# Patient Record
Sex: Female | Born: 1970 | Race: Black or African American | Hispanic: No | Marital: Single | Smoking: Never smoker
Health system: Southern US, Community
[De-identification: ages and names within clinical notes are randomized; demographics above are authoritative.]

## PROBLEM LIST (undated history)

## (undated) DIAGNOSIS — R030 Elevated blood-pressure reading, without diagnosis of hypertension: Secondary | ICD-10-CM

## (undated) DIAGNOSIS — B977 Papillomavirus as the cause of diseases classified elsewhere: Secondary | ICD-10-CM

## (undated) DIAGNOSIS — Z8742 Personal history of other diseases of the female genital tract: Secondary | ICD-10-CM

## (undated) DIAGNOSIS — D259 Leiomyoma of uterus, unspecified: Secondary | ICD-10-CM

## (undated) HISTORY — DX: Personal history of other diseases of the female genital tract: Z87.42

## (undated) HISTORY — PX: WISDOM TOOTH EXTRACTION: SHX21

## (undated) HISTORY — DX: Leiomyoma of uterus, unspecified: D25.9

## (undated) HISTORY — DX: Papillomavirus as the cause of diseases classified elsewhere: B97.7

## (undated) HISTORY — PX: OTHER SURGICAL HISTORY: SHX169

## (undated) HISTORY — DX: Elevated blood-pressure reading, without diagnosis of hypertension: R03.0

---

## 2001-05-20 ENCOUNTER — Emergency Department (HOSPITAL_COMMUNITY): Admission: EM | Admit: 2001-05-20 | Discharge: 2001-05-20 | Payer: Self-pay | Admitting: Emergency Medicine

## 2004-11-10 ENCOUNTER — Ambulatory Visit: Payer: Self-pay | Admitting: Internal Medicine

## 2005-01-06 ENCOUNTER — Ambulatory Visit: Payer: Self-pay | Admitting: Internal Medicine

## 2005-02-04 ENCOUNTER — Ambulatory Visit: Payer: Self-pay | Admitting: Internal Medicine

## 2005-05-25 ENCOUNTER — Ambulatory Visit: Payer: Self-pay | Admitting: Internal Medicine

## 2006-03-30 ENCOUNTER — Other Ambulatory Visit: Admission: RE | Admit: 2006-03-30 | Discharge: 2006-03-30 | Payer: Self-pay | Admitting: Internal Medicine

## 2006-03-30 ENCOUNTER — Encounter: Payer: Self-pay | Admitting: Internal Medicine

## 2006-03-30 ENCOUNTER — Ambulatory Visit: Payer: Self-pay | Admitting: Internal Medicine

## 2006-03-30 LAB — CONVERTED CEMR LAB
Chol/HDL Ratio, serum: 3.6
Glucose, Bld: 93 mg/dL (ref 70–99)
HCT: 41.4 % (ref 36.0–46.0)
LDL Cholesterol: 111 mg/dL — ABNORMAL HIGH (ref 0–99)
MCHC: 33.4 g/dL (ref 30.0–36.0)
TSH: 0.93 microintl units/mL (ref 0.35–5.50)
VLDL: 10 mg/dL (ref 0–40)
WBC: 3.7 10*3/uL — ABNORMAL LOW (ref 4.5–10.5)

## 2007-04-02 ENCOUNTER — Ambulatory Visit: Payer: Self-pay | Admitting: Internal Medicine

## 2007-04-02 ENCOUNTER — Other Ambulatory Visit: Admission: RE | Admit: 2007-04-02 | Discharge: 2007-04-02 | Payer: Self-pay | Admitting: Internal Medicine

## 2007-04-02 ENCOUNTER — Encounter: Payer: Self-pay | Admitting: Internal Medicine

## 2007-04-02 DIAGNOSIS — J45909 Unspecified asthma, uncomplicated: Secondary | ICD-10-CM | POA: Insufficient documentation

## 2007-04-05 ENCOUNTER — Encounter (INDEPENDENT_AMBULATORY_CARE_PROVIDER_SITE_OTHER): Payer: Self-pay | Admitting: *Deleted

## 2007-04-05 LAB — CONVERTED CEMR LAB
BUN: 10 mg/dL (ref 6–23)
Calcium: 9 mg/dL (ref 8.4–10.5)
Cholesterol: 153 mg/dL (ref 0–200)
Eosinophils Absolute: 0 10*3/uL (ref 0.0–0.6)
GFR calc Af Amer: 106 mL/min
GFR calc non Af Amer: 87 mL/min
HDL: 57.5 mg/dL (ref >39.0)
Hemoglobin: 13 g/dL (ref 12.0–15.0)
Lymphocytes Relative: 21.3 % (ref 12.0–46.0)
MCHC: 34.9 g/dL (ref 30.0–36.0)
MCV: 85.3 fL (ref 78.0–100.0)
Monocytes Absolute: 0.4 10*3/uL (ref 0.2–0.7)
Monocytes Relative: 4.4 % (ref 3.0–11.0)
Neutro Abs: 6.6 10*3/uL (ref 1.4–7.7)
Platelets: 296 10*3/uL (ref 150–400)
Potassium: 3.5 meq/L (ref 3.5–5.1)
Triglycerides: 71 mg/dL (ref 0–149)

## 2007-07-31 ENCOUNTER — Encounter: Payer: Self-pay | Admitting: Internal Medicine

## 2008-06-13 DIAGNOSIS — D259 Leiomyoma of uterus, unspecified: Secondary | ICD-10-CM

## 2008-06-18 ENCOUNTER — Ambulatory Visit (HOSPITAL_COMMUNITY): Admission: RE | Admit: 2008-06-18 | Discharge: 2008-06-18 | Payer: Self-pay | Admitting: Obstetrics & Gynecology

## 2009-05-19 ENCOUNTER — Telehealth (INDEPENDENT_AMBULATORY_CARE_PROVIDER_SITE_OTHER): Payer: Self-pay | Admitting: *Deleted

## 2009-05-20 ENCOUNTER — Ambulatory Visit: Payer: Self-pay | Admitting: Internal Medicine

## 2009-05-20 DIAGNOSIS — R030 Elevated blood-pressure reading, without diagnosis of hypertension: Secondary | ICD-10-CM

## 2009-05-20 HISTORY — DX: Elevated blood-pressure reading, without diagnosis of hypertension: R03.0

## 2009-05-22 LAB — CONVERTED CEMR LAB
BUN: 11 mg/dL (ref 6–23)
Basophils Relative: 1.7 % (ref 0.0–3.0)
Chloride: 105 meq/L (ref 96–112)
Creatinine, Ser: 0.8 mg/dL (ref 0.4–1.2)
Eosinophils Relative: 4.2 % (ref 0.0–5.0)
GFR calc non Af Amer: 104.18 mL/min (ref >60)
HCT: 44.5 % (ref 36.0–46.0)
MCV: 97.9 fL (ref 78.0–100.0)
Monocytes Absolute: 0.3 10*3/uL (ref 0.1–1.0)
Monocytes Relative: 7.1 % (ref 3.0–12.0)
Neutrophils Relative %: 41.5 % — ABNORMAL LOW (ref 43.0–77.0)
Platelets: 254 10*3/uL (ref 150.0–400.0)
Potassium: 4.6 meq/L (ref 3.5–5.1)
RBC: 4.54 M/uL (ref 3.87–5.11)
TSH: 0.75 microintl units/mL (ref 0.35–5.50)
WBC: 4 10*3/uL — ABNORMAL LOW (ref 4.5–10.5)

## 2009-05-26 ENCOUNTER — Encounter (INDEPENDENT_AMBULATORY_CARE_PROVIDER_SITE_OTHER): Payer: Self-pay | Admitting: *Deleted

## 2009-05-26 ENCOUNTER — Ambulatory Visit: Payer: Self-pay | Admitting: Internal Medicine

## 2009-09-02 ENCOUNTER — Telehealth (INDEPENDENT_AMBULATORY_CARE_PROVIDER_SITE_OTHER): Payer: Self-pay | Admitting: *Deleted

## 2010-07-13 NOTE — Progress Notes (Signed)
Summary: rx  Phone Note Refill Request Call back at [phone] Message from:  Patient  Refills Requested: Medication #1:  ALBUTEROL 90 MCG/ACT  AERS PRN [facility] [address] [place]  Initial call taken by: Kandice Hams,  September 02, 2009 3:01 PM  Follow-up for Phone Call        Spoke with pt informed rx sent to [facility], ov due and scheduled .Kandice Hams  September 02, 2009 3:31 PM  Follow-up by: Kandice Hams,  September 02, 2009 3:31 PM    Prescriptions: ALBUTEROL 90 MCG/ACT  AERS (ALBUTEROL) PRN  #1 x 0   Entered by:   Kandice Hams   Authorized by:   Nolon Rod. Paz MD   Signed by:   Kandice Hams on 09/02/2009   Method used:   Faxed to ...       [facility] [address] [id number]* (retail)       [address] [place], [place]  [address]       Ph: [phone]       Fax: [phone]   RxID:   [id number]

## 2011-01-05 ENCOUNTER — Ambulatory Visit (INDEPENDENT_AMBULATORY_CARE_PROVIDER_SITE_OTHER): Payer: 59 | Admitting: Internal Medicine

## 2011-01-05 ENCOUNTER — Encounter: Payer: Self-pay | Admitting: Internal Medicine

## 2011-01-05 DIAGNOSIS — Z0000 Encounter for general adult medical examination without abnormal findings: Secondary | ICD-10-CM | POA: Insufficient documentation

## 2011-01-05 DIAGNOSIS — K219 Gastro-esophageal reflux disease without esophagitis: Secondary | ICD-10-CM

## 2011-01-05 DIAGNOSIS — M549 Dorsalgia, unspecified: Secondary | ICD-10-CM

## 2011-01-05 MED ORDER — ESOMEPRAZOLE MAGNESIUM 40 MG PO CPDR: 40.0000 mg | DELAYED_RELEASE_CAPSULE | Freq: Every day | ORAL | Status: DC

## 2011-01-05 MED ORDER — ALBUTEROL 90 MCG/ACT IN AERS: 2.0000 | INHALATION_SPRAY | RESPIRATORY_TRACT | Status: DC | PRN

## 2011-01-05 NOTE — Patient Instructions (Signed)
Came back fasting: FLP CBC CMP TSH---- dx V70 nexium x 2 months, then as needed Call if the stomach is not improving or if symptoms resurface

## 2011-01-05 NOTE — Assessment & Plan Note (Addendum)
See PHI, has back pain, no hip problems;rec judicious use of motrin-tylenol, refer  to Dr Christell Faith

## 2011-01-05 NOTE — Progress Notes (Signed)
  Subjective:    Patient ID: Maureen Spencer, female    DOB: [birth year], 40 y.o.   MRN: [id number]  HPI Complete physical exam From time to time she has heartburn, also occasional epigastric burning feeling that the food gets stuck in the stomach. No chest pain. Is not taking any medications for acid. No history of EGD ;occasionally has nausea, no vomiting. Denies constipation or blood in the stools.   Also from time to time has "hip pain" , she actually pointed out to the right lower back. Pain is worse if she needs to sit down in a very low chair  Past Medical History  Diagnosis Date  . Asthma    No past surgical history on file. History   Social History  . Marital Status: Married    Spouse Name: N/A    Number of Children: 1  . Years of Education: N/A   Occupational History  . [employer], manager account dept.    Social History Main Topics  . Smoking status: Never Smoker   . Smokeless tobacco: Not on file  . Alcohol Use: Yes     Social  . Drug Use: No  . Sexually Active: Not on file   Other Topics Concern  . Not on file   Social History Narrative   Has a 49 y/o son--- diet:does try to eat healthy--- exercise ~ 3 /week   Family History  Problem Relation Age of Onset  . Diabetes Other     Grandmonther-not specific  . Coronary artery disease Neg Hx   . Colon cancer Neg Hx   . Breast cancer Neg Hx       Review of Systems  Constitutional: Negative for fever and fatigue.  Respiratory: Negative for cough and wheezing.   Cardiovascular: Negative for chest pain and leg swelling.  Genitourinary:       NL periods, not heavy  Psychiatric/Behavioral:       No anxiety- depression       Objective:   Physical Exam  Constitutional: She is oriented to person, place, and time. She appears well-developed and well-nourished. No distress.  HENT:  Head: Normocephalic and atraumatic.  Eyes: No scleral icterus.  Neck: No thyromegaly present.  Cardiovascular: Normal rate, regular  rhythm and normal heart sounds.   No murmur heard. Pulmonary/Chest: Effort normal and breath sounds normal. No respiratory distress. She has no wheezes. She has no rales.  Abdominal: Soft. Bowel sounds are normal. She exhibits no distension. There is no tenderness. There is no rebound and no guarding.  Musculoskeletal: She exhibits no edema.       Slightly tender at the right lower back. Hip rotation normal bilaterally. Lower extremity strength and gait normal  Neurological: She is alert and oriented to person, place, and time.  Skin: Skin is warm and dry. She is not diaphoretic.  Psychiatric: She has a normal mood and affect. Her behavior is normal. Judgment and thought content normal.          Assessment & Plan:

## 2011-01-05 NOTE — Assessment & Plan Note (Addendum)
Td  2003 Never had a Cscope Sees gyn regularly  Labs Cont w/ healthy life style

## 2011-01-05 NOTE — Assessment & Plan Note (Signed)
occ heartburn and epigastric dyscomfort, rec nexium x 2 months, then prn, if sx persist or worse , pt will call for a referral

## 2012-01-11 ENCOUNTER — Encounter: Payer: Self-pay | Admitting: *Deleted

## 2012-01-11 ENCOUNTER — Telehealth: Payer: Self-pay | Admitting: Internal Medicine

## 2012-01-11 ENCOUNTER — Ambulatory Visit (INDEPENDENT_AMBULATORY_CARE_PROVIDER_SITE_OTHER): Payer: 59 | Admitting: Internal Medicine

## 2012-01-11 ENCOUNTER — Encounter: Payer: Self-pay | Admitting: Internal Medicine

## 2012-01-11 VITALS — BP 124/72 | HR 81 | Temp 98.5°F | Wt 175.2 lb

## 2012-01-11 DIAGNOSIS — N390 Urinary tract infection, site not specified: Secondary | ICD-10-CM

## 2012-01-11 LAB — POCT URINALYSIS DIPSTICK
Bilirubin, UA: NEGATIVE
Glucose, UA: NEGATIVE
Ketones, UA: NEGATIVE
Nitrite, UA: POSITIVE

## 2012-01-11 MED ORDER — ALBUTEROL SULFATE HFA 108 (90 BASE) MCG/ACT IN AERS: 2.0000 | INHALATION_SPRAY | RESPIRATORY_TRACT | Status: DC | PRN

## 2012-01-11 MED ORDER — NITROFURANTOIN MONOHYD MACRO 100 MG PO CAPS: 100.0000 mg | ORAL_CAPSULE | Freq: Two times a day (BID) | ORAL | Status: AC

## 2012-01-11 NOTE — Telephone Encounter (Signed)
Caller: [name]/Patient; PCP: Marga Melnick; CB#: [phone];  Call regarding Medication Question.  Seen in office on 7/31.  Pharmacy has not rec'd Rx. for Nitrofurantoin.  Uses  [facility] on [address].  Verified pharmacy information in Epic and the Rx in EMR.  Called in Rx Macrobid as per order.  Caller informed. Medication Questions protocol used.

## 2012-01-11 NOTE — Patient Instructions (Addendum)
Drink as much nondairy fluids as possible. Avoid spicy foods or alcohol as  these may aggravate the symptoms. Do not take decongestants. Avoid narcotics if possible.  Please try to go on My Chart within the next 24 hours to allow me to release the results directly to you. www

## 2012-01-11 NOTE — Progress Notes (Signed)
  Subjective:    Patient ID: Maureen Spencer, female    DOB: [birth year], 41 y.o.   MRN: [id number]  HPI She began to have urinary frequency 01/10/12 associated with terminal pressure with urination. Last night she experienced nocturia X9. She's also noted some blood on the tissue but not in the toilet water or in  the urine itself. She also denies pyuria.  Today she's had some left flank sharp pain  Past medical history/family history/social history were all reviewed and updated. No pertinent data    Review of Systems She denies fever, chills, or sweats. She has not had frequent urinary tract infections     Objective:   Physical Exam General appearance is one of good health and nourishment w/o distress.  Eyes: No conjunctival inflammation or scleral icterus is present.  Oral exam: Dental hygiene is good; lips and gums are healthy appearing.There is no oropharyngeal erythema or exudate noted.   Heart:  Normal rate and regular rhythm. S1 and S2 normal without gallop, murmur, click, rub or other extra sounds .S4   Lungs:Chest clear to auscultation; no wheezes, rhonchi,rales ,or rubs present.No increased work of breathing.   Abdomen: bowel sounds normal, soft and non-tender without masses, organomegaly or hernias noted.  No guarding or rebound . No pain to percussion over the left lung.  Musculoskeletal: Negative straight leg raising to 90 bilaterally  Skin:Warm & dry.  Intact without suspicious lesions or rashes. Striae  Lymphatic: No lymphadenopathy is noted about the head, neck, axilla areas.              Assessment & Plan:  #1 frequency, nocturia, terminal dysuria, and slight hematuria. Associated left flank pain without fever, chills, or sweats. Urine shows moderate/2+ leukocytes; positive nitrites and moderate blood. Culture pending  #2 past medical history of possible angioedema with penicillin  Plan: Nitrofurantoin pending urine culture and sensitivity results

## 2012-01-12 NOTE — Telephone Encounter (Signed)
I spoke with patient and she confirmed rx was called in by triage nurse and she picked rx up

## 2012-01-14 LAB — URINE CULTURE: Colony Count: >=100000

## 2012-05-09 ENCOUNTER — Other Ambulatory Visit: Payer: Self-pay | Admitting: Internal Medicine

## 2012-05-09 NOTE — Telephone Encounter (Signed)
Refill done. Pt must schedule OV for future refills.  

## 2012-07-28 ENCOUNTER — Other Ambulatory Visit: Payer: Self-pay

## 2012-10-20 ENCOUNTER — Other Ambulatory Visit: Payer: Self-pay | Admitting: Internal Medicine

## 2012-10-22 NOTE — Telephone Encounter (Signed)
Unable to RF, arrange a OV if pt so desires

## 2012-10-22 NOTE — Telephone Encounter (Signed)
Left detailed msg on pt's vmal to call office to schedule OV.

## 2012-10-22 NOTE — Telephone Encounter (Signed)
Pt has not been seen in over a year. OK to refill? 

## 2012-10-31 ENCOUNTER — Ambulatory Visit (INDEPENDENT_AMBULATORY_CARE_PROVIDER_SITE_OTHER): Payer: 59 | Admitting: Internal Medicine

## 2012-10-31 ENCOUNTER — Encounter: Payer: Self-pay | Admitting: Internal Medicine

## 2012-10-31 VITALS — BP 134/84 | HR 69 | Temp 99.0°F | Wt 175.0 lb

## 2012-10-31 DIAGNOSIS — J45909 Unspecified asthma, uncomplicated: Secondary | ICD-10-CM

## 2012-10-31 MED ORDER — ALBUTEROL SULFATE HFA 108 (90 BASE) MCG/ACT IN AERS: 2.0000 | INHALATION_SPRAY | Freq: Four times a day (QID) | RESPIRATORY_TRACT | Status: DC | PRN

## 2012-10-31 NOTE — Assessment & Plan Note (Addendum)
Asthma under excellent control with albuterol pre- exercise. She gets her general checkups at gynecology consequently she does not need to come back but every 2 years and as needed. Albuterol refill. Recommend a flu shot yearly.

## 2012-10-31 NOTE — Patient Instructions (Addendum)
If you are  feeling well and you see the gynecologist regularly, next visit in 2 years. Continue using albuterol as you are doing, if the need for albuterol increases please let me know. I recommend a flu shot every season.

## 2012-10-31 NOTE — Progress Notes (Signed)
  Subjective:    Patient ID: Maureen Spencer, female    DOB: 08/21/1970, 42 y.o.   MRN: [id number]  HPI Here for asthma eval. Since the last time I saw her in 2012 she is doing well, she is very active, runs about 3 times a week and uses her albuterol preexercise with good results. Other than that, Uses albuterol hardly ever.  Past Medical History  Diagnosis Date  . Asthma    Past Surgical History  Procedure Laterality Date  . Wisdom tooth extraction    . G 1 p 1     History   Social History  . Marital Status: Married    Spouse Name: N/A    Number of Children: 1  . Years of Education: N/A   Occupational History  . [employer], manager account dept.    Social History Main Topics  . Smoking status: Never Smoker   . Smokeless tobacco: Never Used  . Alcohol Use: Yes     Comment: Socially  . Drug Use: No  . Sexually Active: Not on file   Other Topics Concern  . Not on file   Social History Narrative   Divorced, has a 16 y/o son      Review of Systems No chest pain or shortness or breath. No lower extremity edema. Has not gotten a flu shot ever    Objective:   Physical Exam  General -- alert, well-developed, NAD .   Lungs -- normal respiratory effort, no intercostal retractions, no accessory muscle use, and normal breath sounds.   Heart-- normal rate, regular rhythm, no murmur, and no gallop.    Neurologic-- alert & oriented X3 and strength normal in all extremities. Psych-- Cognition and judgment appear intact. Alert and cooperative with normal attention span and concentration.  not anxious appearing and not depressed appearing.       Assessment & Plan:

## 2012-11-01 ENCOUNTER — Encounter: Payer: Self-pay | Admitting: Internal Medicine

## 2013-04-18 ENCOUNTER — Other Ambulatory Visit: Payer: Self-pay

## 2013-06-13 DIAGNOSIS — B977 Papillomavirus as the cause of diseases classified elsewhere: Secondary | ICD-10-CM

## 2013-06-13 HISTORY — DX: Papillomavirus as the cause of diseases classified elsewhere: B97.7

## 2014-01-30 ENCOUNTER — Ambulatory Visit (INDEPENDENT_AMBULATORY_CARE_PROVIDER_SITE_OTHER): Payer: 59 | Admitting: Internal Medicine

## 2014-01-30 ENCOUNTER — Encounter: Payer: Self-pay | Admitting: Internal Medicine

## 2014-01-30 ENCOUNTER — Telehealth: Payer: Self-pay

## 2014-01-30 VITALS — BP 138/74 | HR 74 | Temp 98.5°F | Wt 187.0 lb

## 2014-01-30 DIAGNOSIS — R030 Elevated blood-pressure reading, without diagnosis of hypertension: Secondary | ICD-10-CM

## 2014-01-30 LAB — CBC WITH DIFFERENTIAL/PLATELET
BASOS PCT: 0.8 % (ref 0.0–3.0)
Basophils Absolute: 0 10*3/uL (ref 0.0–0.1)
EOS ABS: 0.2 10*3/uL (ref 0.0–0.7)
EOS PCT: 3.1 % (ref 0.0–5.0)
HCT: 42.9 % (ref 36.0–46.0)
Hemoglobin: 14 g/dL (ref 12.0–15.0)
LYMPHS PCT: 41.3 % (ref 12.0–46.0)
Lymphs Abs: 2.5 10*3/uL (ref 0.7–4.0)
MCHC: 32.6 g/dL (ref 30.0–36.0)
MCV: 95.1 fl (ref 78.0–100.0)
Monocytes Absolute: 0.4 10*3/uL (ref 0.1–1.0)
Monocytes Relative: 6.7 % (ref 3.0–12.0)
NEUTROS PCT: 48.1 % (ref 43.0–77.0)
Neutro Abs: 2.9 10*3/uL (ref 1.4–7.7)
Platelets: 289 10*3/uL (ref 150.0–400.0)
RBC: 4.51 Mil/uL (ref 3.87–5.11)
RDW: 14 % (ref 11.5–15.5)
WBC: 6.1 10*3/uL (ref 4.0–10.5)

## 2014-01-30 LAB — COMPREHENSIVE METABOLIC PANEL
ALBUMIN: 3.8 g/dL (ref 3.5–5.2)
ALT: 18 U/L (ref 0–35)
AST: 18 U/L (ref 0–37)
Alkaline Phosphatase: 35 U/L — ABNORMAL LOW (ref 39–117)
BUN: 13 mg/dL (ref 6–23)
CALCIUM: 9 mg/dL (ref 8.4–10.5)
CHLORIDE: 103 meq/L (ref 96–112)
CO2: 26 mEq/L (ref 19–32)
Creatinine, Ser: 0.8 mg/dL (ref 0.4–1.2)
GFR: 109.5 mL/min (ref >60.00)
GLUCOSE: 73 mg/dL (ref 70–99)
POTASSIUM: 4.2 meq/L (ref 3.5–5.1)
Sodium: 138 mEq/L (ref 135–145)
Total Bilirubin: 0.6 mg/dL (ref 0.2–1.2)
Total Protein: 7.3 g/dL (ref 6.0–8.3)

## 2014-01-30 LAB — TSH: TSH: 0.41 u[IU]/mL (ref 0.35–4.50)

## 2014-01-30 MED ORDER — ALBUTEROL SULFATE HFA 108 (90 BASE) MCG/ACT IN AERS: 2.0000 | INHALATION_SPRAY | Freq: Four times a day (QID) | RESPIRATORY_TRACT | Status: DC | PRN

## 2014-01-30 NOTE — Progress Notes (Signed)
   Subjective:    Patient ID: Maureen Spencer, female    DOB: 1970/09/05, 43 y.o.   MRN: [id number]  DOS:  01/30/2014 Type of visit - description: acute History: 3 days ago was at work had headache, check her BP and it was in the 160s. The next day at home it was 152/89. This morning 120/75. She is concerned about the recent high numbers. The headache was mild but persistent, not the worse of her life, last night, she took an Aleve and this morning the headache is gone. (Otherwise not taking NSAIDs)   ROS Admits to some increased stress at work. She has been on vacation lately and is very possible she has been eating more  salt than usual. Denies chest pain, difficulty breathing or lower extremity edema No nausea, vomiting, diarrhea.  Past Medical History  Diagnosis Date  . Asthma     Past Surgical History  Procedure Laterality Date  . Wisdom tooth extraction    . G 1 p 1      History   Social History  . Marital Status: Married    Spouse Name: N/A    Number of Children: 1  . Years of Education: N/A   Occupational History  . [employer], manager account dept.    Social History Main Topics  . Smoking status: Never Smoker   . Smokeless tobacco: Never Used  . Alcohol Use: Yes     Comment: Socially  . Drug Use: No  . Sexual Activity: Not on file   Other Topics Concern  . Not on file   Social History Narrative   Divorced, has a 39 y/o son         Medication List       This list is accurate as of: 01/30/14  3:41 PM.  Always use your most recent med list.               albuterol 108 (90 BASE) MCG/ACT inhaler  Commonly known as:  VENTOLIN HFA  Inhale 2 puffs into the lungs every 6 (six) hours as needed for wheezing.     TRINESSA (28) 0.18/0.215/0.25 MG-35 MCG tablet  Generic drug:  Norgestimate-Ethinyl Estradiol Triphasic  Take by mouth as directed.           Objective:   Physical Exam BP 138/74  Pulse 74  Temp(Src) 98.5 F (36.9 C) (Oral)  Wt 187 lb  (84.823 kg)  SpO2 100%  LMP 12/30/2013 General -- alert, well-developed, NAD.  Neck --no thyromegaly  HEENT-- Not pale.  Lungs -- normal respiratory effort, no intercostal retractions, no accessory muscle use, and normal breath sounds.  Heart-- normal rate, regular rhythm, no murmur.   Extremities-- trace pretibial edema bilaterally  Neurologic--  alert & oriented X3. Speech normal, gait appropriate for age, strength symmetric and appropriate for age.  Psych-- Cognition and judgment appear intact. Cooperative with normal attention span and concentration. No anxious or depressed appearing.      Assessment & Plan:

## 2014-01-30 NOTE — Telephone Encounter (Signed)
Spoke with Pt to let her know that medication has been sent to [facility].

## 2014-01-30 NOTE — Progress Notes (Signed)
Pre-visit discussion using our clinic review tool. No additional management support is needed unless otherwise documented below in the visit note.  

## 2014-01-30 NOTE — Telephone Encounter (Signed)
I sent a refill, Let patient know

## 2014-01-30 NOTE — Patient Instructions (Signed)
Get your blood work before you leave   Check the  blood pressure 2 or 3 times a  week be sure it is between 110/60 and 140/85. Ideal blood pressure is 120/80. If it is consistently higher or lower, let me know  Next visit by November ,  fasting Please make an appointment     Low-Sodium Eating Plan Sodium raises blood pressure and causes water to be held in the body. Getting less sodium from food will help lower your blood pressure, reduce any swelling, and protect your heart, liver, and kidneys. We get sodium by adding salt (sodium chloride) to food. Most of our sodium comes from canned, boxed, and frozen foods. Restaurant foods, fast foods, and pizza are also very high in sodium. Even if you take medicine to lower your blood pressure or to reduce fluid in your body, getting less sodium from your food is important. WHAT IS MY PLAN? Most people should limit their sodium intake to 2,300 mg a day. Your health care provider recommends that you limit your sodium intake to __________ a day.  WHAT DO I NEED TO KNOW ABOUT THIS EATING PLAN? For the low-sodium eating plan, you will follow these general guidelines:  Choose foods with a % Daily Value for sodium of less than 5% (as listed on the food label).   Use salt-free seasonings or herbs instead of table salt or sea salt.   Check with your health care provider or pharmacist before using salt substitutes.   Eat fresh foods.  Eat more vegetables and fruits.  Limit canned vegetables. If you do use them, rinse them well to decrease the sodium.   Limit cheese to 1 oz (28 g) per day.   Eat lower-sodium products, often labeled as "lower sodium" or "no salt added."  Avoid foods that contain monosodium glutamate (MSG). MSG is sometimes added to [place] food and some canned foods.  Check food labels (Nutrition Facts labels) on foods to learn how much sodium is in one serving.  Eat more home-cooked food and less restaurant, buffet, and fast  food.  When eating at a restaurant, ask that your food be prepared with less salt or none, if possible.  HOW DO I READ FOOD LABELS FOR SODIUM INFORMATION? The Nutrition Facts label lists the amount of sodium in one serving of the food. If you eat more than one serving, you must multiply the listed amount of sodium by the number of servings. Food labels may also identify foods as:  Sodium free--Less than 5 mg in a serving.  Very low sodium--35 mg or less in a serving.  Low sodium--140 mg or less in a serving.  Light in sodium--50% less sodium in a serving. For example, if a food that usually has 300 mg of sodium is changed to become light in sodium, it will have 150 mg of sodium.  Reduced sodium--25% less sodium in a serving. For example, if a food that usually has 400 mg of sodium is changed to reduced sodium, it will have 300 mg of sodium. WHAT FOODS CAN I EAT? Grains Low-sodium cereals, including oats, puffed wheat and rice, and shredded wheat cereals. Low-sodium crackers. Unsalted rice and pasta. Lower-sodium bread.  Vegetables Frozen or fresh vegetables. Low-sodium or reduced-sodium canned vegetables. Low-sodium or reduced-sodium tomato sauce and paste. Low-sodium or reduced-sodium tomato and vegetable juices.  Fruits Fresh, frozen, and canned fruit. Fruit juice.  Meat and Other Protein Products Low-sodium canned tuna and salmon. Fresh or frozen meat, poultry,  seafood, and fish. Lamb. Unsalted nuts. Dried beans, peas, and lentils without added salt. Unsalted canned beans. Homemade soups without salt. Eggs.  Dairy Milk. Soy milk. Ricotta cheese. Low-sodium or reduced-sodium cheeses. Yogurt.  Condiments Fresh and dried herbs and spices. Salt-free seasonings. Onion and garlic powders. Low-sodium varieties of mustard and ketchup. Lemon juice.  Fats and Oils Reduced-sodium salad dressings. Unsalted butter.  Other Unsalted popcorn and pretzels.  The items listed above may  not be a complete list of recommended foods or beverages. Contact your dietitian for more options. WHAT FOODS ARE NOT RECOMMENDED? Grains Instant hot cereals. Bread stuffing, pancake, and biscuit mixes. Croutons. Seasoned rice or pasta mixes. Noodle soup cups. Boxed or frozen macaroni and cheese. Self-rising flour. Regular salted crackers. Vegetables Regular canned vegetables. Regular canned tomato sauce and paste. Regular tomato and vegetable juices. Frozen vegetables in sauces. Salted french fries. Olives. Angie Fava. Relishes. Sauerkraut. Salsa. Meat and Other Protein Products Salted, canned, smoked, spiced, or pickled meats, seafood, or fish. Bacon, ham, sausage, hot dogs, corned beef, chipped beef, and packaged luncheon meats. Salt pork. Jerky. Pickled herring. Anchovies, regular canned tuna, and sardines. Salted nuts. Dairy Processed cheese and cheese spreads. Cheese curds. Blue cheese and cottage cheese. Buttermilk.  Condiments Onion and garlic salt, seasoned salt, table salt, and sea salt. Canned and packaged gravies. Worcestershire sauce. Tartar sauce. Barbecue sauce. Teriyaki sauce. Soy sauce, including reduced sodium. Steak sauce. Fish sauce. Oyster sauce. Cocktail sauce. Horseradish. Regular ketchup and mustard. Meat flavorings and tenderizers. Bouillon cubes. Hot sauce. Tabasco sauce. Marinades. Taco seasonings. Relishes. Fats and Oils Regular salad dressings. Salted butter. Margarine. Ghee. Bacon fat.  Other Potato and tortilla chips. Corn chips and puffs. Salted popcorn and pretzels. Canned or dried soups. Pizza. Frozen entrees and pot pies.  The items listed above may not be a complete list of foods and beverages to avoid. Contact your dietitian for more information. Document Released: 11/19/2001 Document Revised: 06/04/2013 Document Reviewed: 04/03/2013 [facility] Patient Information 2015 [place], [place]. This information is not intended to replace advice given to you by your  health care provider. Make sure you discuss any questions you have with your health care provider.

## 2014-01-30 NOTE — Assessment & Plan Note (Addendum)
New issue. Mildly elevated BP recently, BP today is better. She had mild headache which is gone . Increased BP may have been due to the stress, recent weight gain, recent increase in salt intake. Plan: Labs Monitor BPs We discussed low salt diet, exercise, wt loss  If HA resurface or  BP continued to be elevated he she will call before her next f/u 04-2014

## 2014-01-30 NOTE — Telephone Encounter (Signed)
Pt is requesting Albuterol inhalers refilled.   Last fill date: 10/31/2012 with 5 RF.    Please Advise.

## 2014-03-14 ENCOUNTER — Other Ambulatory Visit: Payer: Self-pay | Admitting: Obstetrics and Gynecology

## 2014-03-14 DIAGNOSIS — Z1239 Encounter for other screening for malignant neoplasm of breast: Secondary | ICD-10-CM

## 2014-03-17 ENCOUNTER — Ambulatory Visit
Admission: RE | Admit: 2014-03-17 | Discharge: 2014-03-17 | Disposition: A | Discharge status: Home / Self Care | Payer: 59 | Source: Ambulatory Visit | Attending: Obstetrics and Gynecology | Admitting: Obstetrics and Gynecology

## 2014-03-17 DIAGNOSIS — Z1239 Encounter for other screening for malignant neoplasm of breast: Secondary | ICD-10-CM

## 2014-03-26 ENCOUNTER — Other Ambulatory Visit: Payer: Self-pay | Admitting: Obstetrics and Gynecology

## 2014-05-02 ENCOUNTER — Encounter: Payer: Self-pay | Admitting: Internal Medicine

## 2014-05-02 ENCOUNTER — Ambulatory Visit (INDEPENDENT_AMBULATORY_CARE_PROVIDER_SITE_OTHER): Payer: 59 | Admitting: Internal Medicine

## 2014-05-02 VITALS — BP 149/94 | HR 81 | Temp 98.3°F | Ht 67.0 in | Wt 190.5 lb

## 2014-05-02 DIAGNOSIS — Z0000 Encounter for general adult medical examination without abnormal findings: Secondary | ICD-10-CM

## 2014-05-02 DIAGNOSIS — Z23 Encounter for immunization: Secondary | ICD-10-CM

## 2014-05-02 LAB — LIPID PANEL
CHOLESTEROL: 154 mg/dL (ref 0–200)
HDL: 47.3 mg/dL (ref >39.00)
LDL CALC: 86 mg/dL (ref 0–99)
NonHDL: 106.7
TRIGLYCERIDES: 106 mg/dL (ref 0.0–149.0)
Total CHOL/HDL Ratio: 3
VLDL: 21.2 mg/dL (ref 0.0–40.0)

## 2014-05-02 NOTE — Assessment & Plan Note (Addendum)
Td  2003 and today Declined flu shot  Never had a Cscope Sees gyn regularly  Labs-- FLP Cont w/ healthy life style   Other issues: Elevated BP, ambulatory BPs usually 120/80, recommend to continue checking Asthma, very rarely uses albuterol GERD symptoms at this time Follow-up in one year

## 2014-05-02 NOTE — Progress Notes (Signed)
Pre visit review using our clinic review tool, if applicable. No additional management support is needed unless otherwise documented below in the visit note. 

## 2014-05-02 NOTE — Progress Notes (Signed)
   Subjective:    Patient ID: Maureen Spencer, female    DOB: 08/13/1970, 43 y.o.   MRN: [id number]  DOS:  05/02/2014 Type of visit - description : CPX Interval history: Occasional chest pain, mid anterior chest, lasting seconds, radiates bilaterally, not associated with exertion or deep breaths.   ROS No  SOB. No palpitations, no lower extremity edema Denies  nausea, vomiting diarrhea, blood in the stools No abdominal pain  (-) cough, sputum production (-) wheezing, chest congestion Rarely uses albuterol   No anxiety, depression   Past Medical History  Diagnosis Date  . Asthma   . ELEVATED BLOOD PRESSURE 05/20/2009    Past Surgical History  Procedure Laterality Date  . Wisdom tooth extraction    . G 1 p 1      History   Social History  . Marital Status: Divorced    Spouse Name: N/A    Number of Children: 1  . Years of Education: N/A   Occupational History  . [employer], manager account dept.    Social History Main Topics  . Smoking status: Never Smoker   . Smokeless tobacco: Never Used  . Alcohol Use: Yes     Comment: Socially  . Drug Use: No  . Sexual Activity: Not on file   Other Topics Concern  . Not on file   Social History Narrative   Divorced, has a 6 y/o son      Family History  Problem Relation Age of Onset  . Diabetes Other     Grandmonther-not specific  . Coronary artery disease Neg Hx   . Colon cancer Neg Hx   . Breast cancer Neg Hx   . Kidney disease Neg Hx        Medication List       This list is accurate as of: 05/02/14 11:59 PM.  Always use your most recent med list.               albuterol 108 (90 BASE) MCG/ACT inhaler  Commonly known as:  VENTOLIN HFA  Inhale 2 puffs into the lungs every 6 (six) hours as needed for wheezing.     levonorgestrel-ethinyl estradiol 0.1-20 MG-MCG tablet  Commonly known as:  AVIANE,ALESSE,LESSINA  Take 1 tablet by mouth daily.           Objective:   Physical Exam BP 149/94 mmHg  Pulse  81  Temp(Src) 98.3 F (36.8 C) (Oral)  Ht 5\' 7"  (1.702 m)  Wt 190 lb 8 oz (86.41 kg)  BMI 29.83 kg/m2  SpO2 97%  LMP 04/22/2014 General -- alert, well-developed, NAD.  Neck --no thyromegaly , normal carotid pulse  HEENT-- Not pale.  R Ear-- normal Lungs -- normal respiratory effort, no intercostal retractions, no accessory muscle use, and normal breath sounds.  Heart-- normal rate, regular rhythm, no murmur.  Abdomen-- Not distended, good bowel sounds,soft, non-tender. Extremities-- no pretibial edema bilaterally  Neurologic--  alert & oriented X3. Speech normal, gait appropriate for age, strength symmetric and appropriate for age.  Psych-- Cognition and judgment appear intact. Cooperative with normal attention span and concentration. No anxious or depressed appearing.         Assessment & Plan:

## 2014-05-02 NOTE — Patient Instructions (Signed)
Get your blood work before you leave   Check the  blood pressure 2 or 3 times a  Week  Be sure your blood pressure is between  145/85  and 110/65.  if it is consistently higher or lower, let me know  Please come back to the office 1 year  for a physical exam. Come back fasting

## 2014-10-14 ENCOUNTER — Ambulatory Visit (INDEPENDENT_AMBULATORY_CARE_PROVIDER_SITE_OTHER): Payer: 59 | Admitting: Internal Medicine

## 2014-10-14 ENCOUNTER — Telehealth: Payer: Self-pay | Admitting: Internal Medicine

## 2014-10-14 VITALS — BP 138/88 | HR 91 | Temp 98.6°F | Resp 17 | Ht 66.5 in | Wt 192.0 lb

## 2014-10-14 DIAGNOSIS — J4520 Mild intermittent asthma, uncomplicated: Secondary | ICD-10-CM | POA: Diagnosis not present

## 2014-10-14 MED ORDER — ALBUTEROL SULFATE HFA 108 (90 BASE) MCG/ACT IN AERS: 2.0000 | INHALATION_SPRAY | RESPIRATORY_TRACT | Status: DC | PRN

## 2014-10-14 MED ORDER — PREDNISONE 20 MG PO TABS: ORAL_TABLET | ORAL | Status: DC

## 2014-10-14 NOTE — Progress Notes (Signed)
   Subjective:    Patient ID: Maureen Spencer, female    DOB: 1970-10-22, 44 y.o.   MRN: [id number]  This chart was scribed for Leandrew Koyanagi, MD by Stephania Fragmin, ED Scribe. This patient was seen in room 11 and the patient's care was started at 4:37 PM.   HPI   Chief Complaint  Patient presents with  . Shortness of Breath  . Cough  . [name]  . Chest Pain    HPI Comments: Maureen Spencer is a 44 y.o. female with a history of asthma who presents to the [facility] complaining of SOB.chest tightness/cough-nonprod that began 3 days ago. That day, she had been outside at her friend's cookout. She denies any problems with seasonal allergies. Patient has a history of asthma, but has not had any exacerbations of her asthma in over 3 years. She takes Albuterol as needed. Has been needing it q3-4 h today  She denies any issues with GERD recently.  Review of Systems Noncontributory.    Objective:   Physical Exam  Constitutional: She is oriented to person, place, and time. She appears well-developed and well-nourished. No distress.  HENT:  Head: Normocephalic and atraumatic.  Right Ear: External ear normal.  Left Ear: External ear normal.  Mouth/Throat: Oropharynx is clear and moist.  Eyes: Conjunctivae and EOM are normal. Pupils are equal, round, and reactive to light.  Neck: Neck supple. No tracheal deviation present.  Cardiovascular: Normal rate, regular rhythm and normal heart sounds.   No murmur heard. Pulmonary/Chest: Effort normal. She has wheezes (on forced expiration).  Lymphadenopathy:    She has no cervical adenopathy.  Neurological: She is alert and oriented to person, place, and time. No cranial nerve deficit.  Psychiatric: She has a normal mood and affect.  Nursing note and vitals reviewed. BP 138/88 mmHg  Pulse 91  Temp(Src) 98.6 F (37 C) (Oral)  Resp 17  Ht 5' 6.5" (1.689 m)  Wt 192 lb (87.091 kg)  BMI 30.53 kg/m2  SpO2 98%  LMP  10/07/2014       Assessment & Plan:  Extrinsic asthma, mild intermittent, uncomplicated Chest wall pain due to coughing Meds ordered this encounter  Medications  . predniSONE (DELTASONE) 20 MG tablet    Sig: 3/3/3/2/2/2/1/1/1 single daily dose for 6 days    Dispense:  18 tablet    Refill:  0  . albuterol (PROVENTIL HFA;VENTOLIN HFA) 108 (90 BASE) MCG/ACT inhaler    Sig: Inhale 2 puffs into the lungs every 4 (four) hours as needed for wheezing or shortness of breath.    Dispense:  1 Inhaler    Refill:  3     I have completed the patient encounter in its entirety as documented by the scribe, with editing by me where necessary. Torrance Frech P. Laney Pastor, M.D.

## 2014-10-14 NOTE — Telephone Encounter (Signed)
Patient arrived in urgent care clinic.

## 2014-10-14 NOTE — Telephone Encounter (Signed)
[facility] - Client TELEPHONE ADVICE RECORD [facility]  Patient Name: Maureen Spencer  DOB: [birth year]    Initial Comment Shortness of breath. Has asthma.   Nurse Assessment  Nurse: Justine Null, RN, Rodena Piety Date/Time (Eastern Time): 10/14/2014 3:00:50 PM  Confirm and document reason for call. If symptomatic, describe symptoms. ---callr satted that she has been having difficulty with her breathing since Saturday and has had some shortness of breath. Has asthma. has a rescue inhaler but not helping at present  Has the patient traveled out of the country within the last 30 days? ---No  Does the patient require triage? ---Yes  Related visit to physician within the last 2 weeks? ---No  Does the PT have any chronic conditions? (i.e. diabetes, asthma, etc.) ---Yes  List chronic conditions. ---asthma  Did the patient indicate they were pregnant? ---No     Guidelines    Guideline Title Affirmed Question Affirmed Notes  Breathing Difficulty [1] MODERATE difficulty breathing (e.g., speaks in phrases, SOB even at rest, pulse 100-120) AND [2] NEW-onset or WORSE than normal    Final Disposition User   Go to ED Now Justine Null, RN, Rodena Piety    Comments  caller requested to have the office called for an apointment as she did not want to go to the ED no backline listed and unable to call to give information to the office rriager directed he rto cgo to the ED Verbalized understanding

## 2015-02-18 ENCOUNTER — Other Ambulatory Visit: Payer: Self-pay

## 2015-02-18 DIAGNOSIS — Z1231 Encounter for screening mammogram for malignant neoplasm of breast: Secondary | ICD-10-CM

## 2015-03-18 LAB — HM MAMMOGRAPHY: HM MAMMO: NORMAL

## 2015-03-20 LAB — HM PAP SMEAR

## 2015-03-23 ENCOUNTER — Other Ambulatory Visit: Payer: Self-pay

## 2015-03-27 ENCOUNTER — Ambulatory Visit
Admission: RE | Admit: 2015-03-27 | Discharge: 2015-03-27 | Disposition: A | Discharge status: Home / Self Care | Payer: 59 | Source: Ambulatory Visit

## 2015-03-27 DIAGNOSIS — Z1231 Encounter for screening mammogram for malignant neoplasm of breast: Secondary | ICD-10-CM

## 2015-05-04 ENCOUNTER — Telehealth: Payer: Self-pay | Admitting: Behavioral Health

## 2015-05-04 NOTE — Telephone Encounter (Signed)
Patient cancelled appointment for 05/05/15 at 9:00 AM.

## 2015-05-05 ENCOUNTER — Encounter: Payer: 59 | Admitting: Internal Medicine

## 2015-09-03 ENCOUNTER — Telehealth: Payer: Self-pay

## 2015-09-03 NOTE — Telephone Encounter (Signed)
Pre visit call completed 

## 2015-09-04 ENCOUNTER — Encounter: Payer: Self-pay | Admitting: Internal Medicine

## 2015-09-04 ENCOUNTER — Ambulatory Visit (INDEPENDENT_AMBULATORY_CARE_PROVIDER_SITE_OTHER): Payer: 59 | Admitting: Internal Medicine

## 2015-09-04 VITALS — BP 122/60 | HR 63 | Temp 98.2°F | Ht 67.0 in | Wt 200.1 lb

## 2015-09-04 DIAGNOSIS — Z09 Encounter for follow-up examination after completed treatment for conditions other than malignant neoplasm: Secondary | ICD-10-CM | POA: Insufficient documentation

## 2015-09-04 DIAGNOSIS — Z0000 Encounter for general adult medical examination without abnormal findings: Secondary | ICD-10-CM

## 2015-09-04 MED ORDER — ALBUTEROL SULFATE HFA 108 (90 BASE) MCG/ACT IN AERS: 2.0000 | INHALATION_SPRAY | RESPIRATORY_TRACT | Status: DC | PRN

## 2015-09-04 NOTE — Assessment & Plan Note (Signed)
Asthma: Refill meds

## 2015-09-04 NOTE — Assessment & Plan Note (Addendum)
Td  2015 FH colon ca, F dx age 45 ; as long as she is asymptomatic, we'll schedule a screening colonoscopy at age 85.  Sees gyn regularly , update on mmg  Labs--   BMP, FLP, TSH.  HIV done@[email or web address]  Diet- exercise: states has a hard time loosing wt but just started a healthier diet, she remains active going to spinning classes 2 or 3 times a week. Planning to do calorie counting.

## 2015-09-04 NOTE — Progress Notes (Signed)
Subjective:    Patient ID: Maureen Spencer, female    DOB: May 28, 1971, 45 y.o.   MRN: [id number]  DOS:  09/04/2015 Type of visit - description : CPX Interval history:  Father recently diagnosed with colon cancer age 91. Patient concerned Unable to lose weight.  Wt Readings from Last 3 Encounters:  09/04/15 200 lb 2 oz (90.776 kg)  10/14/14 192 lb (87.091 kg)  05/02/14 190 lb 8 oz (86.41 kg)     Review of Systems  Constitutional: No fever. No chills. No unexplained wt changes. No unusual sweats  HEENT: No dental problems, no ear discharge, no facial swelling, no voice changes. No eye discharge, no eye  redness , no  intolerance to light   Respiratory: No wheezing , no  difficulty breathing. No cough , no mucus production  Cardiovascular: No CP, no leg swelling , no  Palpitations  GI: no nausea, no vomiting, no diarrhea , no  abdominal pain.  No blood in the stools. No dysphagia, no odynophagia    Endocrine: No polyphagia, no polyuria , no polydipsia  GU: No dysuria, gross hematuria, difficulty urinating. No urinary urgency, no frequency.  Musculoskeletal: No joint swellings or unusual aches or pains  Skin: No change in the color of the skin, palor , no  Rash  Allergic, immunologic: No environmental allergies , no  food allergies  Neurological: No dizziness no  syncope. No headaches. No diplopia, no slurred, no slurred speech, no motor deficits, no facial  Numbness  Hematological: No enlarged lymph nodes, no easy bruising , no unusual bleedings  Psychiatry: No suicidal ideas, no hallucinations, no beavior problems, no confusion.  No unusual/severe anxiety, no depression  Past Medical History  Diagnosis Date  . Asthma   . ELEVATED BLOOD PRESSURE 05/20/2009  . 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 History of abnormal cervical Pap smear   . Uterine leiomyoma     Past Surgical History  Procedure Laterality Date  . Wisdom tooth extraction    . G 1 p 1      Social  History   Social History  . Marital Status: Single    Spouse Name: N/A  . Number of Children: 1  . Years of Education: N/A   Occupational History  . [employer], manager account dept.    Social History Main Topics  . Smoking status: Never Smoker   . Smokeless tobacco: Never Used  . Alcohol Use: Yes     Comment: Socially  . Drug Use: No  . Sexual Activity: No   Other Topics Concern  . 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 Diabetes Other     Grandmonther-not specific  . Coronary artery disease Neg Hx   . Colon cancer Father     dx age 33, early dx  . Breast cancer Neg Hx   . Kidney disease Neg Hx   . Hypertension Mother   . Hypertension Father        Medication List       This list is accurate as of: 09/04/15  6:33 PM.  Always use your most recent med list.               albuterol 108 (90 Base) MCG/ACT inhaler  Commonly known as:  PROVENTIL HFA;VENTOLIN HFA  Inhale 2 puffs into the lungs every 4 (four) hours as needed for wheezing or shortness of breath.     hydroquinone 4 % cream  Apply topically at bedtime.     levonorgestrel-ethinyl estradiol 0.1-20 MG-MCG tablet  Commonly known as:  AVIANE,ALESSE,LESSINA  Take 1 tablet by mouth daily. Reported on 09/03/2015           Objective:   Physical Exam BP 122/60 mmHg  Pulse 63  Temp(Src) 98.2 F (36.8 C) (Oral)  Ht 5\' 7"  (1.702 m)  Wt 200 lb 2 oz (90.776 kg)  BMI 31.34 kg/m2  SpO2 98%  LMP 08/12/2015 (Approximate)  General:   Well developed, well nourished . NAD.  Neck: No  thyromegaly  HEENT:  Normocephalic . Face symmetric, atraumatic Lungs:  CTA B Normal respiratory effort, no intercostal retractions, no accessory muscle use. Heart: RRR,  no murmur.  No pretibial edema bilaterally  Abdomen:  Not distended, soft, non-tender. No rebound or rigidity.   Skin: Exposed areas without  rash. Not pale. Not jaundice Neurologic:  alert & oriented X3.  Speech normal, gait appropriate for age and unassisted Strength symmetric and appropriate for age.  Psych: Cognition and judgment appear intact.  Cooperative with normal attention span and concentration.  Behavior appropriate. No anxious or depressed appearing.    Assessment & Plan:   Assessment Asthma H/o HPV, high risk, abnormal Pap Uterine leiomyoma   PLAN  Asthma: Refill meds RTC one year

## 2015-09-04 NOTE — Patient Instructions (Signed)
GO TO THE FRONT DESK  Schedule labs to be done next week, fasting  Schedule your next appointment for a  physical When?   1 year Fasting?  yes If is more convenient we can  see you in the afternoon and check labs the next day

## 2015-09-04 NOTE — Progress Notes (Signed)
Pre visit review using our clinic review tool, if applicable. No additional management support is needed unless otherwise documented below in the visit note. 

## 2015-09-07 ENCOUNTER — Telehealth: Payer: Self-pay

## 2015-09-07 MED ORDER — PROAIR RESPICLICK 108 (90 BASE) MCG/ACT IN AEPB: 2.0000 | INHALATION_SPRAY | RESPIRATORY_TRACT | Status: DC | PRN

## 2015-09-07 NOTE — Telephone Encounter (Signed)
Received medication alternative/PA request for Proventil HFA 90 mcg inhaler, insurance will cover Proair HFA, or Proair Respiclick. Will change to alternative medication and send to pharmacy.

## 2015-09-11 ENCOUNTER — Other Ambulatory Visit (INDEPENDENT_AMBULATORY_CARE_PROVIDER_SITE_OTHER): Payer: 59

## 2015-09-11 DIAGNOSIS — Z0000 Encounter for general adult medical examination without abnormal findings: Secondary | ICD-10-CM

## 2015-09-11 LAB — LIPID PANEL
CHOLESTEROL: 139 mg/dL (ref 0–200)
HDL: 46 mg/dL (ref >39.00)
LDL CALC: 79 mg/dL (ref 0–99)
NonHDL: 93.11
TRIGLYCERIDES: 72 mg/dL (ref 0.0–149.0)
Total CHOL/HDL Ratio: 3
VLDL: 14.4 mg/dL (ref 0.0–40.0)

## 2015-09-11 LAB — BASIC METABOLIC PANEL
BUN: 13 mg/dL (ref 6–23)
CHLORIDE: 106 meq/L (ref 96–112)
CO2: 27 meq/L (ref 19–32)
Calcium: 9.2 mg/dL (ref 8.4–10.5)
Creatinine, Ser: 0.78 mg/dL (ref 0.40–1.20)
GFR: 103.84 mL/min (ref >60.00)
Glucose, Bld: 96 mg/dL (ref 70–99)
POTASSIUM: 4.3 meq/L (ref 3.5–5.1)
SODIUM: 138 meq/L (ref 135–145)

## 2015-09-11 LAB — TSH: TSH: 1.55 u[IU]/mL (ref 0.35–4.50)

## 2015-10-09 ENCOUNTER — Ambulatory Visit (INDEPENDENT_AMBULATORY_CARE_PROVIDER_SITE_OTHER): Payer: 59 | Admitting: Internal Medicine

## 2015-10-09 ENCOUNTER — Encounter: Payer: Self-pay | Admitting: Internal Medicine

## 2015-10-09 VITALS — BP 144/98 | HR 62 | Temp 98.6°F | Ht 67.0 in | Wt 204.8 lb

## 2015-10-09 DIAGNOSIS — R609 Edema, unspecified: Secondary | ICD-10-CM

## 2015-10-09 DIAGNOSIS — Z09 Encounter for follow-up examination after completed treatment for conditions other than malignant neoplasm: Secondary | ICD-10-CM

## 2015-10-09 LAB — URINALYSIS, ROUTINE W REFLEX MICROSCOPIC
Bilirubin Urine: NEGATIVE
Ketones, ur: NEGATIVE
LEUKOCYTES UA: NEGATIVE
NITRITE: NEGATIVE
Specific Gravity, Urine: 1.025 (ref 1.000–1.030)
Total Protein, Urine: NEGATIVE
UROBILINOGEN UA: 0.2 (ref 0.0–1.0)
Urine Glucose: NEGATIVE
pH: 6 (ref 5.0–8.0)

## 2015-10-09 NOTE — Progress Notes (Signed)
Subjective:    Patient ID: Maureen Spencer, female    DOB: 1970-11-26, 45 y.o.   MRN: [id number]  DOS:  10/09/2015 Type of visit - description : Acute visit Interval history: Complains of ankle edema, bilaterally, worse on the left for several months. Edema is usually in the afternoon when she goes back home, better the next morning. She has gained weight despite the fact that she is trying to watch her diet and eating low salt.  Wt Readings from Last 3 Encounters:  10/09/15 204 lb 12.8 oz (92.897 kg)  09/04/15 200 lb 2 oz (90.776 kg)  10/14/14 192 lb (87.091 kg)    Review of Systems Denies chest pain, difficulty breathing or other than occasional asthma episode. No palpitation, orthopnea No cough Urine looks normal, no dysuria or gross hematuria Not taking NSAIDs  Past Medical History  Diagnosis Date  . Asthma   . ELEVATED BLOOD PRESSURE 05/20/2009  . 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 History of abnormal cervical Pap smear   . Uterine leiomyoma     Past Surgical History  Procedure Laterality Date  . Wisdom tooth extraction    . G 1 p 1      Social History   Social History  . Marital Status: Single    Spouse Name: N/A  . Number of Children: 1  . Years of Education: N/A   Occupational History  . [employer], manager account dept.    Social History Main Topics  . Smoking status: Never Smoker   . Smokeless tobacco: Never Used  . Alcohol Use: Yes     Comment: Socially  . Drug Use: No  . Sexual Activity: No   Other Topics Concern  . 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  Always use your most recent med list.               hydroquinone 4 % cream  Apply topically at bedtime.     levonorgestrel-ethinyl estradiol 0.1-20 MG-MCG tablet  Commonly known as:  AVIANE,ALESSE,LESSINA  Take 1 tablet by mouth daily.  Reported on 09/03/2015     PROAIR RESPICLICK 123XX123 (90 Base) MCG/ACT Aepb  Generic drug:  Albuterol Sulfate  Inhale 2 puffs into the lungs every 4 (four) hours as needed.           Objective:   Physical Exam BP 144/98 mmHg  Pulse 62  Temp(Src) 98.6 F (37 C) (Oral)  Ht 5\' 7"  (1.702 m)  Wt 204 lb 12.8 oz (92.897 kg)  BMI 32.07 kg/m2  SpO2 99%  LMP 09/18/2015 General:   Well developed, well nourished . NAD.  HEENT:  Normocephalic . Face symmetric, atraumatic Neck: No JVD at 45 Lungs:  CTA B Normal respiratory effort, no intercostal retractions, no accessory muscle use. Heart: RRR,  no murmur.  No pretibial or ankle edema on today's exam  Skin: Not pale. Not jaundice. No varicose veins of the lower extremities Neurologic:  alert & oriented X3.  Speech normal, gait appropriate for age and unassisted Psych--  Cognition and judgment appear intact.  Cooperative with normal attention span and concentration.  Behavior appropriate. No anxious or depressed appearing.      Assessment & Plan:   Assessment Asthma  H/o HPV, high risk, abnormal Pap Uterine leiomyoma   PLAN   Edema: Healthy 45 year old lady with normal kidney and liver function presents with mild edema, likely pendant . Recommend leg elevation, watch salt intake, weight loss, avoid NSAIDs. For completeness we'll check a UA to rule out proteinuria. Call if no better.

## 2015-10-09 NOTE — Progress Notes (Signed)
Pre visit review using our clinic tool,if applicable. No additional management support is needed unless otherwise documented below in the visit note.  

## 2015-10-09 NOTE — Patient Instructions (Signed)
Go to the lab and provide a urine sample  Leg elevation  Low salt diet  Call if you are getting progressively worse       Low-Sodium Eating Plan Sodium raises blood pressure and causes water to be held in the body. Getting less sodium from food will help lower your blood pressure, reduce any swelling, and protect your heart, liver, and kidneys. We get sodium by adding salt (sodium chloride) to food. Most of our sodium comes from canned, boxed, and frozen foods. Restaurant foods, fast foods, and pizza are also very high in sodium. Even if you take medicine to lower your blood pressure or to reduce fluid in your body, getting less sodium from your food is important. WHAT IS MY PLAN? Most people should limit their sodium intake to 2,300 mg a day. Your health care provider recommends that you limit your sodium intake to __________ a day.  WHAT DO I NEED TO KNOW ABOUT THIS EATING PLAN? For the low-sodium eating plan, you will follow these general guidelines:  Choose foods with a % Daily Value for sodium of less than 5% (as listed on the food label).   Use salt-free seasonings or herbs instead of table salt or sea salt.   Check with your health care provider or pharmacist before using salt substitutes.   Eat fresh foods.  Eat more vegetables and fruits.  Limit canned vegetables. If you do use them, rinse them well to decrease the sodium.   Limit cheese to 1 oz (28 g) per day.   Eat lower-sodium products, often labeled as "lower sodium" or "no salt added."  Avoid foods that contain monosodium glutamate (MSG). MSG is sometimes added to [place] food and some canned foods.  Check food labels (Nutrition Facts labels) on foods to learn how much sodium is in one serving.  Eat more home-cooked food and less restaurant, buffet, and fast food.  When eating at a restaurant, ask that your food be prepared with less salt, or no salt if possible.  HOW DO I READ FOOD LABELS FOR SODIUM  INFORMATION? The Nutrition Facts label lists the amount of sodium in one serving of the food. If you eat more than one serving, you must multiply the listed amount of sodium by the number of servings. Food labels may also identify foods as:  Sodium free--Less than 5 mg in a serving.  Very low sodium--35 mg or less in a serving.  Low sodium--140 mg or less in a serving.  Light in sodium--50% less sodium in a serving. For example, if a food that usually has 300 mg of sodium is changed to become light in sodium, it will have 150 mg of sodium.  Reduced sodium--25% less sodium in a serving. For example, if a food that usually has 400 mg of sodium is changed to reduced sodium, it will have 300 mg of sodium. WHAT FOODS CAN I EAT? Grains Low-sodium cereals, including oats, puffed wheat and rice, and shredded wheat cereals. Low-sodium crackers. Unsalted rice and pasta. Lower-sodium bread.  Vegetables Frozen or fresh vegetables. Low-sodium or reduced-sodium canned vegetables. Low-sodium or reduced-sodium tomato sauce and paste. Low-sodium or reduced-sodium tomato and vegetable juices.  Fruits Fresh, frozen, and canned fruit. Fruit juice.  Meat and Other Protein Products Low-sodium canned tuna and salmon. Fresh or frozen meat, poultry, seafood, and fish. Lamb. Unsalted nuts. Dried beans, peas, and lentils without added salt. Unsalted canned beans. Homemade soups without salt. Eggs.  Dairy Milk. Soy milk. Ricotta cheese.  Low-sodium or reduced-sodium cheeses. Yogurt.  Condiments Fresh and dried herbs and spices. Salt-free seasonings. Onion and garlic powders. Low-sodium varieties of mustard and ketchup. Fresh or refrigerated horseradish. Lemon juice.  Fats and Oils Reduced-sodium salad dressings. Unsalted butter.  Other Unsalted popcorn and pretzels.  The items listed above may not be a complete list of recommended foods or beverages. Contact your dietitian for more options. WHAT FOODS  ARE NOT RECOMMENDED? Grains Instant hot cereals. Bread stuffing, pancake, and biscuit mixes. Croutons. Seasoned rice or pasta mixes. Noodle soup cups. Boxed or frozen macaroni and cheese. Self-rising flour. Regular salted crackers. Vegetables Regular canned vegetables. Regular canned tomato sauce and paste. Regular tomato and vegetable juices. Frozen vegetables in sauces. Salted [place] fries. Olives. Angie Fava. Relishes. Sauerkraut. Salsa. Meat and Other Protein Products Salted, canned, smoked, spiced, or pickled meats, seafood, or fish. Bacon, ham, sausage, hot dogs, corned beef, chipped beef, and packaged luncheon meats. Salt pork. Jerky. Pickled herring. Anchovies, regular canned tuna, and sardines. Salted nuts. Dairy Processed cheese and cheese spreads. Cheese curds. Blue cheese and cottage cheese. Buttermilk.  Condiments Onion and garlic salt, seasoned salt, table salt, and sea salt. Canned and packaged gravies. Worcestershire sauce. Tartar sauce. Barbecue sauce. Teriyaki sauce. Soy sauce, including reduced sodium. Steak sauce. Fish sauce. Oyster sauce. Cocktail sauce. Horseradish that you find on the shelf. Regular ketchup and mustard. Meat flavorings and tenderizers. Bouillon cubes. Hot sauce. Tabasco sauce. Marinades. Taco seasonings. Relishes. Fats and Oils Regular salad dressings. Salted butter. Margarine. Ghee. Bacon fat.  Other Potato and tortilla chips. Corn chips and puffs. Salted popcorn and pretzels. Canned or dried soups. Pizza. Frozen entrees and pot pies.  The items listed above may not be a complete list of foods and beverages to avoid. Contact your dietitian for more information.   This information is not intended to replace advice given to you by your health care provider. Make sure you discuss any questions you have with your health care provider.   Document Released: 11/19/2001 Document Revised: 06/20/2014 Document Reviewed: 04/03/2013 Elsevier Interactive Patient  Education [employer].

## 2015-10-10 NOTE — Assessment & Plan Note (Signed)
Edema: Healthy 45 year old lady with normal kidney and liver function presents with mild edema, likely pendant . Recommend leg elevation, watch salt intake, weight loss, avoid NSAIDs. For completeness we'll check a UA to rule out proteinuria. Call if no better.

## 2016-03-22 ENCOUNTER — Other Ambulatory Visit: Payer: Self-pay | Admitting: Obstetrics and Gynecology

## 2016-03-22 DIAGNOSIS — Z1231 Encounter for screening mammogram for malignant neoplasm of breast: Secondary | ICD-10-CM

## 2016-04-19 LAB — HM PAP SMEAR

## 2016-04-21 ENCOUNTER — Ambulatory Visit
Admission: RE | Admit: 2016-04-21 | Discharge: 2016-04-21 | Disposition: A | Discharge status: Home / Self Care | Payer: 59 | Source: Ambulatory Visit | Attending: Obstetrics and Gynecology | Admitting: Obstetrics and Gynecology

## 2016-04-21 DIAGNOSIS — Z1231 Encounter for screening mammogram for malignant neoplasm of breast: Secondary | ICD-10-CM

## 2016-04-21 LAB — HM MAMMOGRAPHY

## 2016-04-22 ENCOUNTER — Encounter: Payer: Self-pay | Admitting: Internal Medicine

## 2016-04-25 ENCOUNTER — Other Ambulatory Visit: Payer: Self-pay | Admitting: Obstetrics and Gynecology

## 2016-04-25 DIAGNOSIS — R928 Other abnormal and inconclusive findings on diagnostic imaging of breast: Secondary | ICD-10-CM

## 2016-04-27 ENCOUNTER — Ambulatory Visit
Admission: RE | Admit: 2016-04-27 | Discharge: 2016-04-27 | Disposition: A | Discharge status: Home / Self Care | Payer: 59 | Source: Ambulatory Visit | Attending: Obstetrics and Gynecology | Admitting: Obstetrics and Gynecology

## 2016-04-27 ENCOUNTER — Other Ambulatory Visit: Payer: Self-pay | Admitting: Obstetrics and Gynecology

## 2016-04-27 DIAGNOSIS — R928 Other abnormal and inconclusive findings on diagnostic imaging of breast: Secondary | ICD-10-CM

## 2016-09-22 ENCOUNTER — Other Ambulatory Visit: Payer: Self-pay | Admitting: Internal Medicine

## 2017-04-18 ENCOUNTER — Other Ambulatory Visit: Payer: Self-pay | Admitting: Obstetrics and Gynecology

## 2017-04-18 DIAGNOSIS — Z1231 Encounter for screening mammogram for malignant neoplasm of breast: Secondary | ICD-10-CM

## 2017-04-28 ENCOUNTER — Encounter: Payer: Self-pay | Admitting: Internal Medicine

## 2017-04-28 ENCOUNTER — Ambulatory Visit (INDEPENDENT_AMBULATORY_CARE_PROVIDER_SITE_OTHER): Payer: BLUE CROSS/BLUE SHIELD | Admitting: Internal Medicine

## 2017-04-28 VITALS — BP 128/80 | HR 62 | Temp 98.0°F | Resp 14 | Ht 67.0 in | Wt 201.2 lb

## 2017-04-28 DIAGNOSIS — Z0000 Encounter for general adult medical examination without abnormal findings: Secondary | ICD-10-CM | POA: Diagnosis not present

## 2017-04-28 DIAGNOSIS — B009 Herpesviral infection, unspecified: Secondary | ICD-10-CM | POA: Insufficient documentation

## 2017-04-28 LAB — CBC WITH DIFFERENTIAL/PLATELET
BASOS ABS: 0.1 10*3/uL (ref 0.0–0.1)
Basophils Relative: 1 % (ref 0.0–3.0)
EOS PCT: 1.8 % (ref 0.0–5.0)
Eosinophils Absolute: 0.1 10*3/uL (ref 0.0–0.7)
HCT: 42.3 % (ref 36.0–46.0)
HEMOGLOBIN: 13.9 g/dL (ref 12.0–15.0)
LYMPHS ABS: 2.3 10*3/uL (ref 0.7–4.0)
Lymphocytes Relative: 46.6 % — ABNORMAL HIGH (ref 12.0–46.0)
MCHC: 32.9 g/dL (ref 30.0–36.0)
MCV: 94.2 fl (ref 78.0–100.0)
Monocytes Absolute: 0.4 10*3/uL (ref 0.1–1.0)
Monocytes Relative: 8.1 % (ref 3.0–12.0)
NEUTROS PCT: 42.5 % — AB (ref 43.0–77.0)
Neutro Abs: 2.1 10*3/uL (ref 1.4–7.7)
Platelets: 288 10*3/uL (ref 150.0–400.0)
RBC: 4.49 Mil/uL (ref 3.87–5.11)
RDW: 13.7 % (ref 11.5–15.5)
WBC: 4.9 10*3/uL (ref 4.0–10.5)

## 2017-04-28 LAB — COMPREHENSIVE METABOLIC PANEL
ALK PHOS: 50 U/L (ref 39–117)
ALT: 17 U/L (ref 0–35)
AST: 16 U/L (ref 0–37)
Albumin: 4.1 g/dL (ref 3.5–5.2)
BILIRUBIN TOTAL: 0.6 mg/dL (ref 0.2–1.2)
BUN: 14 mg/dL (ref 6–23)
CO2: 27 meq/L (ref 19–32)
CREATININE: 0.76 mg/dL (ref 0.40–1.20)
Calcium: 9.3 mg/dL (ref 8.4–10.5)
Chloride: 104 mEq/L (ref 96–112)
GFR: 106.19 mL/min (ref >60.00)
GLUCOSE: 71 mg/dL (ref 70–99)
Potassium: 3.9 mEq/L (ref 3.5–5.1)
SODIUM: 139 meq/L (ref 135–145)
Total Protein: 6.9 g/dL (ref 6.0–8.3)

## 2017-04-28 LAB — LIPID PANEL
CHOL/HDL RATIO: 3
Cholesterol: 159 mg/dL (ref 0–200)
HDL: 46 mg/dL (ref >39.00)
LDL Cholesterol: 102 mg/dL — ABNORMAL HIGH (ref 0–99)
NONHDL: 112.52
Triglycerides: 54 mg/dL (ref 0.0–149.0)
VLDL: 10.8 mg/dL (ref 0.0–40.0)

## 2017-04-28 LAB — TSH: TSH: 1.05 u[IU]/mL (ref 0.35–4.50)

## 2017-04-28 MED ORDER — ALBUTEROL SULFATE HFA 108 (90 BASE) MCG/ACT IN AERS: 2.0000 | INHALATION_SPRAY | RESPIRATORY_TRACT | 5 refills | Status: DC | PRN

## 2017-04-28 NOTE — Patient Instructions (Signed)
GO TO THE LAB : Get the blood work     GO TO THE FRONT DESK Schedule your next appointment for a physical exam in 1 year, fasting

## 2017-04-28 NOTE — Progress Notes (Signed)
Subjective:    Patient ID: Maureen Spencer, female    DOB: [birth year], 46 y.o.   MRN: [id number]  DOS:  04/28/2017 Type of visit - description : cpx Interval history: No major concerns  Wt Readings from Last 3 Encounters:  04/28/17 201 lb 4 oz (91.3 kg)  10/09/15 204 lb 12.8 oz (92.9 kg)  09/04/15 200 lb 2 oz (90.8 kg)     Review of Systems Did develop a cough a month ago, went to urgent care, prescribed prednisone and cough medication, overall better but he still has spells of cough. Denies fever, chills, runny nose, sore throat, sinus congestion, GERD symptoms. Has no hear any wheezing.  Other than above, a 14 point review of systems is negative    Past Medical History:  Diagnosis Date  . Asthma   . ELEVATED BLOOD PRESSURE 05/20/2009  . History of abnormal cervical Pap smear   . HPV in female 89   High risk  . Uterine leiomyoma     Past Surgical History:  Procedure Laterality Date  . G 1 P 1    . WISDOM TOOTH EXTRACTION      Social History   Socioeconomic History  . Marital status: Single    Spouse name: Not on file  . Number of children: 1  . Years of education: Not on file  . Highest education level: Not on file  Social Needs  . Financial resource strain: Not on file  . Food insecurity - worry: Not on file  . Food insecurity - inability: Not on file  . Transportation needs - medical: Not on file  . Transportation needs - non-medical: Not on file  Occupational History  . Occupation: [employer], Magazine features editor.  Tobacco Use  . Smoking status: Never Smoker  . Smokeless tobacco: Never Used  Substance and Sexual Activity  . Alcohol use: Yes    Comment: Socially  . Drug use: No  . Sexual activity: No    Birth control/protection: Abstinence  Other Topics Concern  . Not on file  Social History Narrative   Divorced, has a 97, he is going to [employer], lives w/ her         Family History  Problem Relation Age of Onset  . Hypertension Mother   .  Stomach cancer Mother 26  . Colon cancer Father        dx age 22, early dx  . Hypertension Father   . Diabetes Other        Grandmonther-not specific  . Coronary artery disease Neg Hx   . Breast cancer Neg Hx   . Kidney disease Neg Hx      Allergies as of 04/28/2017      Reactions   Penicillins    Facial swelling      Medication List        Accurate as of 04/28/17 11:59 PM. Always use your most recent med list.          albuterol 108 (90 Base) MCG/ACT inhaler Commonly known as:  PROAIR HFA Inhale 2 puffs every 4 (four) hours as needed into the lungs for wheezing or shortness of breath.   doxycycline 100 MG tablet Commonly known as:  VIBRA-TABS          Objective:   Physical Exam BP 128/80 (BP Location: Left Arm, Patient Position: Sitting, Cuff Size: Normal)   Pulse 62   Temp 98 F (36.7 C) (Oral)   Resp 14   Ht  5\' 7"  (1.702 m)   Wt 201 lb 4 oz (91.3 kg)   LMP 04/17/2017 (Exact Date)   SpO2 98%   BMI 31.52 kg/m  General:   Well developed, well nourished . NAD.  Neck: No  thyromegaly  HEENT:  Normocephalic . Face symmetric, atraumatic Lungs:  CTA B Normal respiratory effort, no intercostal retractions, no accessory muscle use. Heart: RRR,  no murmur.  No pretibial edema bilaterally  Abdomen:  Not distended, soft, non-tender. No rebound or rigidity.   Skin: Exposed areas without rash. Not pale. Not jaundice Neurologic:  alert & oriented X3.  Speech normal, gait appropriate for age and unassisted Strength symmetric and appropriate for age.  Psych: Cognition and judgment appear intact.  Cooperative with normal attention span and concentration.  Behavior appropriate. No anxious or depressed appearing.     Assessment & Plan:   Assessment Asthma H/o HPV, high risk, abnormal Pap Uterine leiomyoma Not on BCP (condoms) Acne: on doxy  PLAN   Asthma: Refill albuterol Cough: As described above,ROS (-);  in the context of asthma, cough  could be  d/t  bronchospasm.  She is getting somewhat better, recommend to use albuterol p.r.n. if she is not back to normal in 2-3 weeks will call, consider trial with Symbicort twice daily for 1 month. RTC 1 year

## 2017-04-28 NOTE — Progress Notes (Signed)
Pre visit review using our clinic review tool, if applicable. No additional management support is needed unless otherwise documented below in the visit note. 

## 2017-04-28 NOTE — Assessment & Plan Note (Signed)
-  Td  2015; declined flu shot  -FH colon ca, F dx age 46; had a colonoscopy.  New guidelines are encouraging earlier screening,b/c FH  will start at age 49.. -female care: per gyn, next visit in 2 weeks , to have a mmg then -labs: She is not fasting, will check a CMP, CBC, TSH and lipids -Diet- exercise: She is doing great, very active, eating healthy.  Somewhat frustrated because she has not lost any weight.  Encouraged to continue her healthy lifestyle, information about the bariatric office provided.

## 2017-04-30 NOTE — Assessment & Plan Note (Signed)
Asthma: Refill albuterol Cough: As described above,ROS (-);  in the context of asthma, cough  could be d/t  bronchospasm.  She is getting somewhat better, recommend to use albuterol p.r.n. if she is not back to normal in 2-3 weeks will call, consider trial with Symbicort twice daily for 1 month. RTC 1 year

## 2017-05-26 ENCOUNTER — Ambulatory Visit
Admission: RE | Admit: 2017-05-26 | Discharge: 2017-05-26 | Disposition: A | Discharge status: Home / Self Care | Payer: BLUE CROSS/BLUE SHIELD | Source: Ambulatory Visit | Attending: Obstetrics and Gynecology | Admitting: Obstetrics and Gynecology

## 2017-05-26 DIAGNOSIS — L309 Dermatitis, unspecified: Secondary | ICD-10-CM | POA: Insufficient documentation

## 2017-05-26 DIAGNOSIS — Z1231 Encounter for screening mammogram for malignant neoplasm of breast: Secondary | ICD-10-CM

## 2017-06-19 ENCOUNTER — Encounter: Payer: Self-pay | Admitting: Internal Medicine

## 2017-06-19 ENCOUNTER — Ambulatory Visit (HOSPITAL_BASED_OUTPATIENT_CLINIC_OR_DEPARTMENT_OTHER)
Admission: RE | Admit: 2017-06-19 | Discharge: 2017-06-19 | Disposition: A | Discharge status: Home / Self Care | Payer: BLUE CROSS/BLUE SHIELD | Source: Ambulatory Visit | Attending: Internal Medicine | Admitting: Internal Medicine

## 2017-06-19 ENCOUNTER — Ambulatory Visit (INDEPENDENT_AMBULATORY_CARE_PROVIDER_SITE_OTHER): Payer: BLUE CROSS/BLUE SHIELD | Admitting: Internal Medicine

## 2017-06-19 VITALS — BP 140/86 | HR 81 | Temp 98.4°F | Resp 16 | Ht 66.0 in | Wt 201.0 lb

## 2017-06-19 DIAGNOSIS — Z23 Encounter for immunization: Secondary | ICD-10-CM | POA: Diagnosis not present

## 2017-06-19 DIAGNOSIS — R05 Cough: Secondary | ICD-10-CM | POA: Diagnosis present

## 2017-06-19 DIAGNOSIS — J4540 Moderate persistent asthma, uncomplicated: Secondary | ICD-10-CM | POA: Insufficient documentation

## 2017-06-19 MED ORDER — BUDESONIDE-FORMOTEROL FUMARATE 160-4.5 MCG/ACT IN AERO
2.0000 | INHALATION_SPRAY | Freq: Two times a day (BID) | RESPIRATORY_TRACT | 3 refills | Status: DC
Start: 2017-06-19 — End: 2017-06-26

## 2017-06-19 NOTE — Progress Notes (Signed)
Subjective:    Patient ID: Maureen Spencer, female    DOB: 05/11/1971, 47 y.o.   MRN: [id number]  DOS:  06/19/2017 Type of visit - description : acute Interval history: Since the last visit, she continue with cough, persistent, daily, not worse at night. Typically, she uses albuterol within an hour the cough decreases.   Review of Systems Denies itchy eyes or nose, minimal postnasal dripping. No GERD type of symptoms.  Past Medical History:  Diagnosis Date  . Asthma   . ELEVATED BLOOD PRESSURE 05/20/2009  . History of abnormal cervical Pap smear   . 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 Uterine leiomyoma     Past Surgical History:  Procedure Laterality Date  . G 1 P 1    . WISDOM TOOTH EXTRACTION      Social History   Socioeconomic History  . Marital status: Single    Spouse name: Not on file  . Number of children: 1  . Years of education: Not on file  . Highest education level: Not on file  Social Needs  . Financial resource strain: Not on file  . Food insecurity - worry: Not on file  . Food insecurity - inability: Not on file  . Transportation needs - medical: Not on file  . Transportation needs - non-medical: Not on file  Occupational History  . Occupation: [employer], Magazine features editor.  Tobacco Use  . Smoking status: Never Smoker  . Smokeless tobacco: Never Used  Substance and Sexual Activity  . Alcohol use: Yes    Comment: Socially  . Drug use: No  . Sexual activity: No    Birth control/protection: Abstinence  Other Topics Concern  . Not on file  Social History Narrative   Divorced, has a 90, he is going to [facility], lives w/ her          Allergies as of 06/19/2017      Reactions   Penicillins    Facial swelling      Medication List        Accurate as of 06/19/17 11:59 PM. Always use your most recent med list.          albuterol 108 (90 Base) MCG/ACT inhaler Commonly known as:  PROAIR HFA Inhale 2 puffs every 4 (four) hours as needed into the  lungs for wheezing or shortness of breath.   budesonide-formoterol 160-4.5 MCG/ACT inhaler Commonly known as:  SYMBICORT Inhale 2 puffs into the lungs 2 (two) times daily.   doxycycline 100 MG tablet Commonly known as:  VIBRA-TABS          Objective:   Physical Exam BP 140/86 (BP Location: Right Arm, Patient Position: Sitting, Cuff Size: Small)   Pulse 81   Temp 98.4 F (36.9 C) (Oral)   Resp 16   Ht 5\' 6"  (1.676 m)   Wt 201 lb (91.2 kg)   LMP 06/13/2017   SpO2 98%   BMI 32.44 kg/m  General:   Well developed, well nourished . NAD.  HEENT:  Normocephalic . Face symmetric, atraumatic.  TMs normal, nose not congested Lungs:  CTA B except for a mildly increased expiratory time Normal respiratory effort, no intercostal retractions, no accessory muscle use. Heart: RRR,  no murmur.  No pretibial edema bilaterally  Skin: Not pale. Not jaundice Neurologic:  alert & oriented X3.  Speech normal, gait appropriate for age and unassisted Psych--  Cognition and judgment appear intact.  Cooperative with normal attention  span and concentration.  Behavior appropriate. No anxious or depressed appearing.      Assessment & Plan:   Assessment Asthma H/o HPV, high risk, abnormal Pap Uterine leiomyoma Not on BCP (condoms) Acne: on doxy  PLAN   Cough: As described above, still suspect is due to asthma.  For completeness we will get a chest x-ray, will add Symbicort 2 puffs twice a day.  Cont albuterol for rescue inhaler.  Patient will call in 4 weeks, if she is doing better will decrease Symbicort dose or switch to Qvar. Asthma: As above Flu shot today Checkup in 2-3 months.

## 2017-06-19 NOTE — Patient Instructions (Signed)
  GO TO THE FRONT DESK Schedule your next appointment for a   checkup in 2-3 months   STOP BY THE FIRST FLOOR:  get the XR    Symbicort 2 puffs twice a day every day.  Get a coupon, GoodRx? Albuterol as needed for persistent cough or wheezing Call in 4 weeks, let me know how you are doing.  We might be able to reduce your Symbicort

## 2017-06-19 NOTE — Progress Notes (Signed)
Pre visit review using our clinic review tool, if applicable. No additional management support is needed unless otherwise documented below in the visit note. 

## 2017-06-20 NOTE — Assessment & Plan Note (Signed)
Cough: As described above, still suspect is due to asthma.  For completeness we will get a chest x-ray, will add Symbicort 2 puffs twice a day.  Cont albuterol for rescue inhaler.  Patient will call in 4 weeks, if she is doing better will decrease Symbicort dose or switch to Qvar. Asthma: As above Flu shot today Checkup in 2-3 months.

## 2017-06-26 ENCOUNTER — Telehealth: Payer: Self-pay | Admitting: Internal Medicine

## 2017-06-26 MED ORDER — BUDESONIDE-FORMOTEROL FUMARATE 160-4.5 MCG/ACT IN AERO: 2.0000 | INHALATION_SPRAY | Freq: Two times a day (BID) | RESPIRATORY_TRACT | 5 refills | Status: DC

## 2017-06-26 NOTE — Telephone Encounter (Signed)
Please advise 

## 2017-06-26 NOTE — Telephone Encounter (Signed)
Copied from [place] [id number]. Topic: Quick Communication - See Telephone Encounter >> Jun 26, 2017  1:40 PM Ether Griffins B wrote: CRM for notification. See Telephone encounter for:  Pt has been  taking symbicort and is reporting her cough is still there but not as bad and the medication is working fine. Pt was told to let Dr. Larose Kells know so he could tell her to continue has she is or if she needs to change the dose  06/26/17.

## 2017-06-26 NOTE — Telephone Encounter (Signed)
Spoke w/ Pt, informed of recommendations. Pt verbalized understanding. Symbicort refilled to [facility].

## 2017-06-26 NOTE — Telephone Encounter (Signed)
Since she is better but she still has some cough, recommend to continue Symbicort 2 puffs twice a day. Call in 1 months and let me know how she is doing

## 2018-04-11 ENCOUNTER — Other Ambulatory Visit: Payer: Self-pay | Admitting: Obstetrics and Gynecology

## 2018-04-11 DIAGNOSIS — Z1231 Encounter for screening mammogram for malignant neoplasm of breast: Secondary | ICD-10-CM

## 2018-05-04 ENCOUNTER — Ambulatory Visit (INDEPENDENT_AMBULATORY_CARE_PROVIDER_SITE_OTHER): Payer: BLUE CROSS/BLUE SHIELD | Admitting: Internal Medicine

## 2018-05-04 ENCOUNTER — Encounter: Payer: Self-pay | Admitting: Internal Medicine

## 2018-05-04 VITALS — BP 122/80 | HR 70 | Temp 98.3°F | Resp 16 | Ht 66.0 in | Wt 196.5 lb

## 2018-05-04 DIAGNOSIS — Z800 Family history of malignant neoplasm of digestive organs: Secondary | ICD-10-CM

## 2018-05-04 DIAGNOSIS — Z0000 Encounter for general adult medical examination without abnormal findings: Secondary | ICD-10-CM

## 2018-05-04 DIAGNOSIS — Z23 Encounter for immunization: Secondary | ICD-10-CM | POA: Diagnosis not present

## 2018-05-04 LAB — LIPID PANEL
Cholesterol: 167 mg/dL (ref <200)
HDL: 51 mg/dL (ref >50)
LDL Cholesterol (Calc): 101 mg/dL (calc) — ABNORMAL HIGH
Non-HDL Cholesterol (Calc): 116 mg/dL (calc) (ref <130)
TRIGLYCERIDES: 66 mg/dL (ref <150)
Total CHOL/HDL Ratio: 3.3 (calc) (ref <5.0)

## 2018-05-04 LAB — CBC WITH DIFFERENTIAL/PLATELET
BASOS ABS: 51 {cells}/uL (ref 0–200)
Basophils Relative: 0.8 %
Eosinophils Absolute: 58 cells/uL (ref 15–500)
Eosinophils Relative: 0.9 %
HCT: 40.9 % (ref 35.0–45.0)
Hemoglobin: 13.5 g/dL (ref 11.7–15.5)
Lymphs Abs: 2714 cells/uL (ref 850–3900)
MCH: 29.1 pg (ref 27.0–33.0)
MCHC: 33 g/dL (ref 32.0–36.0)
MCV: 88.1 fL (ref 80.0–100.0)
MPV: 11.7 fL (ref 7.5–12.5)
Monocytes Relative: 7.7 %
NEUTROS PCT: 48.2 %
Neutro Abs: 3085 cells/uL (ref 1500–7800)
Platelets: 296 10*3/uL (ref 140–400)
RBC: 4.64 10*6/uL (ref 3.80–5.10)
RDW: 13.6 % (ref 11.0–15.0)
Total Lymphocyte: 42.4 %
WBC: 6.4 10*3/uL (ref 3.8–10.8)
WBCMIX: 493 {cells}/uL (ref 200–950)

## 2018-05-04 LAB — COMPREHENSIVE METABOLIC PANEL
AG RATIO: 1.6 (calc) (ref 1.0–2.5)
ALKALINE PHOSPHATASE (APISO): 52 U/L (ref 33–115)
ALT: 15 U/L (ref 6–29)
AST: 15 U/L (ref 10–35)
Albumin: 4.4 g/dL (ref 3.6–5.1)
BILIRUBIN TOTAL: 0.7 mg/dL (ref 0.2–1.2)
BUN: 15 mg/dL (ref 7–25)
CALCIUM: 9.3 mg/dL (ref 8.6–10.2)
CO2: 27 mmol/L (ref 20–32)
Chloride: 102 mmol/L (ref 98–110)
Creat: 0.67 mg/dL (ref 0.50–1.10)
Globulin: 2.8 g/dL (calc) (ref 1.9–3.7)
Glucose, Bld: 76 mg/dL (ref 65–99)
Potassium: 3.8 mmol/L (ref 3.5–5.3)
Sodium: 137 mmol/L (ref 135–146)
Total Protein: 7.2 g/dL (ref 6.1–8.1)

## 2018-05-04 NOTE — Assessment & Plan Note (Addendum)
-  Td  2015;   flu shot today -FH colon ca, F dx age 47; never had a colonoscopy, refer to GI for early screening   -female care: per gyn,   MMG 05/2017 -labs: CMP, FLP, CBC - diung great w/ diet and exercise; yoga 4 times a week

## 2018-05-04 NOTE — Progress Notes (Signed)
Subjective:    Patient ID: Maureen Spencer, female    DOB: 07-15-1970, 47 y.o.   MRN: [id number]  DOS:  05/04/2018 Type of visit - description : cpx No major concerns   Review of Systems Occasionally has puffiness at the size of the ankle bilaterally, usually at the end of the day after being on her feet a long time. Back in March, she fell, landed on her buttocks.  Since then is having some pain near the tailbone. The pain is on and off, not necessarily worse when she sits down. Denies any other pathology such as blood per rectum, rectal itching, vaginal discharge or bleeding.  Other than above, a 14 point review of systems is negative      Past Medical History:  Diagnosis Date  . Asthma   . ELEVATED BLOOD PRESSURE 05/20/2009  . History of abnormal cervical Pap smear   . 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 Uterine leiomyoma     Past Surgical History:  Procedure Laterality Date  . G 1 P 1    . WISDOM TOOTH EXTRACTION      Social History   Socioeconomic History  . Marital status: Single    Spouse name: Not on file  . Number of children: 1  . Years of education: Not on file  . Highest education level: Not on file  Occupational History  . Occupation: [employer], Magazine features editor.  Social Needs  . Financial resource strain: Not on file  . Food insecurity:    Worry: Not on file    Inability: Not on file  . Transportation needs:    Medical: Not on file    Non-medical: Not on file  Tobacco Use  . Smoking status: Never Smoker  . Smokeless tobacco: Never Used  Substance and Sexual Activity  . Alcohol use: Yes    Comment: Socially  . Drug use: No  . Sexual activity: Never    Birth control/protection: Abstinence  Lifestyle  . Physical activity:    Days per week: Not on file    Minutes per session: Not on file  . Stress: Not on file  Relationships  . Social connections:    Talks on phone: Not on file    Gets together: Not on file    Attends religious service:  Not on file    Active member of club or organization: Not on file    Attends meetings of clubs or organizations: Not on file    Relationship status: Not on file  . Intimate partner violence:    Fear of current or ex partner: Not on file    Emotionally abused: Not on file    Physically abused: Not on file    Forced sexual activity: Not on file  Other Topics Concern  . Not on file  Social History Narrative   Divorced, has a 59, he is going to [place], son 68 y/o  lives w/ her   Parents in [place]         Family History  Problem Relation Age of Onset  . Hypertension Mother   . Stomach cancer Mother 17  . Colon cancer Father        dx age 89, early dx  . Hypertension Father   . Diabetes Other        Grandmonther-not specific  . Coronary artery disease Neg Hx   . Breast cancer Neg Hx   . Kidney disease Neg Hx  Allergies as of 05/04/2018      Reactions   Penicillins    Facial swelling      Medication List        Accurate as of 05/04/18 11:59 PM. Always use your most recent med list.          albuterol 108 (90 Base) MCG/ACT inhaler Commonly known as:  PROVENTIL HFA;VENTOLIN HFA Inhale 2 puffs every 4 (four) hours as needed into the lungs for wheezing or shortness of breath.   budesonide-formoterol 160-4.5 MCG/ACT inhaler Commonly known as:  SYMBICORT Inhale 2 puffs into the lungs 2 (two) times daily.   doxycycline 100 MG tablet Commonly known as:  VIBRA-TABS           Objective:   Physical Exam BP 122/80 (BP Location: Left Arm, Patient Position: Sitting, Cuff Size: Small)   Pulse 70   Temp 98.3 F (36.8 C) (Oral)   Resp 16   Ht 5\' 6"  (1.676 m)   Wt 196 lb 8 oz (89.1 kg)   LMP 04/15/2018 (Exact Date)   SpO2 98%   BMI 31.72 kg/m  General: Well developed, NAD, BMI noted Neck: No  thyromegaly  HEENT:  Normocephalic . Face symmetric, atraumatic Lungs:  CTA B Normal respiratory effort, no intercostal retractions, no accessory muscle use. Heart: RRR,   no murmur.  No pretibial edema bilaterally  Abdomen:  Not distended, soft, non-tender. No rebound or rigidity. MSK: No TTP at the lumbosacral spine. Skin: Exposed areas without rash. Not pale. Not jaundice Neurologic:  alert & oriented X3.  Speech normal, gait appropriate for age and unassisted Strength symmetric and appropriate for age.  Psych: Cognition and judgment appear intact.  Cooperative with normal attention span and concentration.  Behavior appropriate. No anxious or depressed appearing.     Assessment & Plan:    Assessment Asthma H/o HPV, high risk, abnormal Pap Uterine leiomyoma Not on BCP (condoms) Acne: on doxy  PLAN   Asthma: Seems well controlled Pain, distal sacrum: Symptoms are somewhat atypical but this started after she fell and landed on her buttocks.  Recommend observation for now. Call in a couple of months if she is not improving, x-ray? RTC 1 year

## 2018-05-04 NOTE — Progress Notes (Signed)
Pre visit review using our clinic review tool, if applicable. No additional management support is needed unless otherwise documented below in the visit note. 

## 2018-05-04 NOTE — Patient Instructions (Signed)
GO TO THE LAB : Get the blood work     GO TO THE FRONT DESK Schedule your next appointment for a  Physical in 1 year

## 2018-05-05 NOTE — Assessment & Plan Note (Signed)
Asthma: Seems well controlled Pain, distal sacrum: Symptoms are somewhat atypical but this started after she fell and landed on her buttocks.  Recommend observation for now. Call in a couple of months if she is not improving, x-ray? RTC 1 year

## 2018-05-28 ENCOUNTER — Ambulatory Visit: Payer: BLUE CROSS/BLUE SHIELD

## 2018-05-29 LAB — HM PAP SMEAR

## 2018-06-04 ENCOUNTER — Encounter: Payer: Self-pay | Admitting: Internal Medicine

## 2018-06-04 ENCOUNTER — Encounter: Payer: Self-pay | Admitting: Gastroenterology

## 2018-06-11 ENCOUNTER — Other Ambulatory Visit: Payer: Self-pay | Admitting: Internal Medicine

## 2018-06-28 ENCOUNTER — Encounter: Payer: Self-pay | Admitting: Gastroenterology

## 2018-06-28 ENCOUNTER — Ambulatory Visit (INDEPENDENT_AMBULATORY_CARE_PROVIDER_SITE_OTHER): Payer: BLUE CROSS/BLUE SHIELD | Admitting: Gastroenterology

## 2018-06-28 VITALS — BP 132/98 | HR 80 | Ht 66.5 in | Wt 200.2 lb

## 2018-06-28 DIAGNOSIS — Z1211 Encounter for screening for malignant neoplasm of colon: Secondary | ICD-10-CM | POA: Diagnosis not present

## 2018-06-28 MED ORDER — NA SULFATE-K SULFATE-MG SULF 17.5-3.13-1.6 GM/177ML PO SOLN: 1.0000 | ORAL | 0 refills | Status: DC

## 2018-06-28 NOTE — Patient Instructions (Signed)
You have been scheduled for a colonoscopy. Please follow written instructions given to you at your visit today.  Please pick up your prep supplies at the pharmacy within the next 1-3 days. If you use inhalers (even only as needed), please bring them with you on the day of your procedure. Your physician has requested that you go to www.startemmi.com and enter the access code given to you at your visit today. This web site gives a general overview about your procedure. However, you should still follow specific instructions given to you by our office regarding your preparation for the procedure.  Tips for colonoscopy:  -STAY WELL HYDRATED FOR 3-4 DAYS PRIOR TO THE EXAM. This reduces nausea and dehydration.  -TO PREVENT SKIN/HEMORRHOID IRRITATION- prior to wiping, put A&Dointment or vaseline on the toilet paper. -Keep a towel or pad on the bed.  -DRINK 64oz of clear liquids in the morning of prep day (PRIOR TO STARTING THE PREP) to be sure that there is enough fluid to flush the colon and stay hydrated!!!! This is in addition to the fluids required for preparation.  

## 2018-06-28 NOTE — Progress Notes (Addendum)
Referring Provider: Colon Branch, MD Primary Care Physician:  Colon Branch, MD   Reason for Consultation: Family history of colon cancer   IMPRESSION:  Family history of colon cancer (father at age 46) Mother with stomach cancer (unknown tumor type) No baseline GI symptoms No prior colon cancer screening  PLAN: Screening colonoscopy  I consented the patient at the bedside today discussing the risks, benefits, and alternatives to endoscopic evaluation. In particular, we discussed the risks that include, but are not limited to, reaction to medication, cardiopulmonary compromise, bleeding requiring blood transfusion, aspiration resulting in pneumonia, perforation requiring surgery, lack of diagnosis, severe illness requiring hospitalization, and even death. We reviewed the risk of missed lesion including polyps or even cancer. The patient acknowledges these risks and asks that we proceed.   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 Larose Kells for family history of colon cancer.  The history is obtained to the patient review of her electronic health record.  She has asthma.    Her father had colon cancer diagnosed at age 53. Mother with gastric cancer. No other known family history of colon cancer or polyps.   She has no baseline GI symptoms.      Past Medical History:  Diagnosis Date  . Asthma   . ELEVATED BLOOD PRESSURE 05/20/2009  . History of abnormal cervical Pap smear   . HPV in female 75   High risk  . Uterine leiomyoma     Past Surgical History:  Procedure Laterality Date  . G 1 P 1    . WISDOM TOOTH EXTRACTION      Current Outpatient Medications  Medication Sig Dispense Refill  . albuterol (PROVENTIL HFA;VENTOLIN HFA) 108 (90 Base) MCG/ACT inhaler Inhale 2 puffs into the lungs every 4 (four) hours as needed for wheezing or shortness of breath. 18 g 5  . budesonide-formoterol (SYMBICORT) 160-4.5 MCG/ACT  inhaler Inhale 2 puffs into the lungs 2 (two) times daily. 10.2 g 5  . doxycycline (VIBRA-TABS) 100 MG tablet as needed.     . Na Sulfate-K Sulfate-Mg Sulf 17.5-3.13-1.6 GM/177ML SOLN Take 1 kit by mouth as directed for 30 days. 354 mL 0   No current facility-administered medications for this visit.     Allergies as of 06/28/2018 - Review Complete 06/28/2018  Allergen Reaction Noted  . Penicillins  01/05/2011    Family History  Problem Relation Age of Onset  . Hypertension Mother   . Stomach cancer Mother 70  . Colon cancer Father        dx age 46, early dx  . Hypertension Father   . Diabetes Other        Grandmonther-not specific  . Coronary artery disease Neg Hx   . Breast cancer Neg Hx   . Kidney disease Neg Hx     Social History   Socioeconomic History  . Marital status: Single    Spouse name: Not on file  . Number of children: 1  . Years of education: Not on file  . Highest education level: Not on file  Occupational History  . Occupation: [employer], Magazine features editor.  Social Needs  . Financial resource strain: Not on file  . Food insecurity:    Worry: Not on file    Inability: Not on file  . Transportation needs:    Medical: Not on file    Non-medical: Not on file  Tobacco Use  .  Smoking status: Never Smoker  . Smokeless tobacco: Never Used  Substance and Sexual Activity  . Alcohol use: Yes    Comment: Socially  . Drug use: No  . Sexual activity: Never    Birth control/protection: Abstinence  Lifestyle  . Physical activity:    Days per week: Not on file    Minutes per session: Not on file  . Stress: Not on file  Relationships  . Social connections:    Talks on phone: Not on file    Gets together: Not on file    Attends religious service: Not on file    Active member of club or organization: Not on file    Attends meetings of clubs or organizations: Not on file    Relationship status: Not on file  . Intimate partner violence:    Fear of current or  ex partner: Not on file    Emotionally abused: Not on file    Physically abused: Not on file    Forced sexual activity: Not on file  Other Topics Concern  . Not on file  Social History Narrative   Divorced, has a 44, he is going to [place], son 66 y/o  lives w/ her   Parents in [place]: 12 system ROS is negative except as noted above.  Filed Weights   06/28/18 0826  Weight: 200 lb 4 oz (90.8 kg)    Physical Exam: Vital signs were reviewed. General:   Alert, well-nourished, pleasant and cooperative in NAD Head:  Normocephalic and atraumatic. Eyes:  Sclera clear, no icterus.   Conjunctiva pink. Mouth:  No deformity or lesions.   Neck:  Supple; no thyromegaly. Lungs:  Clear throughout to auscultation.   No wheezes.  Heart:  Regular rate and rhythm; no murmurs Abdomen:  Soft, nontender, normal bowel sounds. No rebound or guarding. No hepatosplenomegaly Rectal:  Deferred  Msk:  Symmetrical without gross deformities. Extremities:  No gross deformities or edema. Neurologic:  Alert and  oriented x4;  grossly nonfocal Skin:  No rash or bruise. Psych:  Alert and cooperative. Normal mood and affect.   Caswell Alvillar L. Tarri Glenn, MD, MPH [facility] 06/28/2018, 9:57 AM

## 2018-07-06 ENCOUNTER — Encounter: Payer: Self-pay | Admitting: Gastroenterology

## 2018-07-10 ENCOUNTER — Ambulatory Visit
Admission: RE | Admit: 2018-07-10 | Discharge: 2018-07-10 | Disposition: A | Discharge status: Home / Self Care | Payer: BLUE CROSS/BLUE SHIELD | Source: Ambulatory Visit | Attending: Obstetrics and Gynecology | Admitting: Obstetrics and Gynecology

## 2018-07-10 DIAGNOSIS — Z1231 Encounter for screening mammogram for malignant neoplasm of breast: Secondary | ICD-10-CM

## 2018-07-12 ENCOUNTER — Telehealth: Payer: Self-pay | Admitting: Gastroenterology

## 2018-07-12 NOTE — Telephone Encounter (Signed)
Spoke with patient she states that this happened on Tuesday. I informed her this was ok, just be sure to be on clear liquids today and tomorrow and to follow the prep instructions.

## 2018-07-12 NOTE — Telephone Encounter (Signed)
Pt called in and stated that she just now reading her pre op package and she ate some chocolate covered cashews and also ate a cup of corm she is wanting to know if she can still keep appt that is sched 07/13/2018

## 2018-07-13 ENCOUNTER — Encounter: Payer: Self-pay | Admitting: Gastroenterology

## 2018-07-13 ENCOUNTER — Ambulatory Visit (AMBULATORY_SURGERY_CENTER): Payer: BLUE CROSS/BLUE SHIELD | Admitting: Gastroenterology

## 2018-07-13 VITALS — BP 126/81 | HR 86 | Temp 97.3°F | Resp 20 | Ht 66.5 in | Wt 200.0 lb

## 2018-07-13 DIAGNOSIS — Z1211 Encounter for screening for malignant neoplasm of colon: Secondary | ICD-10-CM

## 2018-07-13 DIAGNOSIS — D124 Benign neoplasm of descending colon: Secondary | ICD-10-CM

## 2018-07-13 DIAGNOSIS — Z800 Family history of malignant neoplasm of digestive organs: Secondary | ICD-10-CM

## 2018-07-13 DIAGNOSIS — K635 Polyp of colon: Secondary | ICD-10-CM

## 2018-07-13 DIAGNOSIS — D125 Benign neoplasm of sigmoid colon: Secondary | ICD-10-CM | POA: Diagnosis not present

## 2018-07-13 DIAGNOSIS — D123 Benign neoplasm of transverse colon: Secondary | ICD-10-CM | POA: Diagnosis not present

## 2018-07-13 MED ORDER — SODIUM CHLORIDE 0.9 % IV SOLN: 500.0000 mL | INTRAVENOUS | Status: DC

## 2018-07-13 NOTE — Patient Instructions (Signed)
4 polyps removed today. Dr Tarri Glenn will mail you a letter in 2-3 weeks explaining when to have your next colonoscopy based on the lab report.   YOU HAD AN ENDOSCOPIC PROCEDURE TODAY AT [facility]:   Refer to the procedure report that was given to you for any specific questions about what was found during the examination.  If the procedure report does not answer your questions, please call your gastroenterologist to clarify.  If you requested that your care partner not be given the details of your procedure findings, then the procedure report has been included in a sealed envelope for you to review at your convenience later.  YOU SHOULD EXPECT: Some feelings of bloating in the abdomen. Passage of more gas than usual.  Walking can help get rid of the air that was put into your GI tract during the procedure and reduce the bloating. If you had a lower endoscopy (such as a colonoscopy or flexible sigmoidoscopy) you may notice spotting of blood in your stool or on the toilet paper. If you underwent a bowel prep for your procedure, you may not have a normal bowel movement for a few days.  Please Note:  You might notice some irritation and congestion in your nose or some drainage.  This is from the oxygen used during your procedure.  There is no need for concern and it should clear up in a day or so.  SYMPTOMS TO REPORT IMMEDIATELY:   Following lower endoscopy (colonoscopy or flexible sigmoidoscopy):  Excessive amounts of blood in the stool  Significant tenderness or worsening of abdominal pains  Swelling of the abdomen that is new, acute  Fever of 100F or higher   For urgent or emergent issues, a gastroenterologist can be reached at any hour by calling [phone].   DIET:  We do recommend a small meal at first, but then you may proceed to your regular diet.  Drink plenty of fluids but you should avoid alcoholic beverages for 24 hours.  ACTIVITY:  You should plan to take it  easy for the rest of today and you should NOT DRIVE or use heavy machinery until tomorrow (because of the sedation medicines used during the test).    FOLLOW UP: Our staff will call the number listed on your records the next business day following your procedure to check on you and address any questions or concerns that you may have regarding the information given to you following your procedure. If we do not reach you, we will leave a message.  However, if you are feeling well and you are not experiencing any problems, there is no need to return our call.  We will assume that you have returned to your regular daily activities without incident.  If any biopsies were taken you will be contacted by phone or by letter within the next 1-3 weeks.  Please call us at [phone] if you have not heard about the biopsies in 3 weeks.    SIGNATURES/CONFIDENTIALITY: You and/or your care partner have signed paperwork which will be entered into your electronic medical record.  These signatures attest to the fact that that the information above on your After Visit Summary has been reviewed and is understood.  Full responsibility of the confidentiality of this discharge information lies with you and/or your care-partner.

## 2018-07-13 NOTE — Progress Notes (Signed)
To PACU, VSS. Report to [email or web address] 

## 2018-07-13 NOTE — Op Note (Signed)
[facility] Patient Name: Maureen Spencer Procedure Date: 07/13/2018 10:26 AM MRN: [id number] Endoscopist: Thornton Park MD, MD Age: 48 Referring MD:  Date of Birth: 1970/06/19 Gender: Female Account #: [id number] Procedure:                Colonoscopy Indications:              Screening for colorectal malignant neoplasm, This                            is the patient's first colonoscopy. Father with                            colon cancer at age 45, No baseline GI symptoms. Medicines:                See the Anesthesia note for documentation of the                            administered medications Procedure:                Pre-Anesthesia Assessment:                           - Prior to the procedure, a History and Physical                            was performed, and patient medications and                            allergies were reviewed. The patient's tolerance of                            previous anesthesia was also reviewed. The risks                            and benefits of the procedure and the sedation                            options and risks were discussed with the patient.                            All questions were answered, and informed consent                            was obtained. Prior Anticoagulants: The patient has                            taken no previous anticoagulant or antiplatelet                            agents. ASA Grade Assessment: II - A patient with                            mild systemic disease. After reviewing the risks  and benefits, the patient was deemed in                            satisfactory condition to undergo the procedure.                           After obtaining informed consent, the colonoscope                            was passed under direct vision. Throughout the                            procedure, the patient's blood pressure, pulse, and                            oxygen  saturations were monitored continuously. The                            Colonoscope was introduced through the anus and                            advanced to the the terminal ileum, with                            identification of the appendiceal orifice and IC                            valve. The colonoscopy was performed without                            difficulty. The patient tolerated the procedure                            well. The quality of the bowel preparation was                            good. The terminal ileum, ileocecal valve,                            appendiceal orifice, and rectum were photographed. Scope In: 10:35:04 AM Scope Out: 10:55:21 AM Scope Withdrawal Time: 0 hours 15 minutes 3 seconds  Total Procedure Duration: 0 hours 20 minutes 17 seconds  Findings:                 The perianal and digital rectal examinations were                            normal. Internal hemorrhoids are present.                           A 3 mm polyp was found in the hepatic flexure. The                            polyp was sessile. The polyp was removed with a  cold snare. Resection and retrieval were complete.                            Estimated blood loss was minimal.                           A 2 mm polyp was found in the sigmoid colon. The                            polyp was sessile. The polyp was removed with a                            cold biopsy forceps. Resection and retrieval were                            complete. Estimated blood loss: none.                           A 3 mm polyp was found in the descending colon. The                            polyp was sessile. The polyp was removed with a                            cold snare. Resection and retrieval were complete.                            Estimated blood loss: none.                           A 3 mm polyp was found in the sigmoid colon. The                            polyp was  sessile. The polyp was removed with a                            cold snare. Resection and retrieval were complete.                            Estimated blood loss was minimal.                           The exam was otherwise without abnormality on                            direct and retroflexion views. Complications:            No immediate complications. Estimated blood loss:                            Minimal. Estimated Blood Loss:     Estimated blood loss was minimal. Impression:               - One 3 mm polyp at the  hepatic flexure, removed                            with a cold snare. Resected and retrieved.                           - One 2 mm polyp in the sigmoid colon, removed with                            a cold biopsy forceps. Resected and retrieved.                           - One 3 mm polyp in the descending colon, removed                            with a cold snare. Resected and retrieved.                           - One 3 mm polyp in the sigmoid colon, removed with                            a cold snare. Resected and retrieved.                           - The examination was otherwise normal on direct                            and retroflexion views. Recommendation:           - Patient has a contact number available for                            emergencies. The signs and symptoms of potential                            delayed complications were discussed with the                            patient. Return to normal activities tomorrow.                            Written discharge instructions were provided to the                            patient.                           - Resume previous diet today.                           - Continue present medications.                           - Await pathology results.                           -  Repeat colonoscopy in 5 years for surveillance if                            at least one or two polyps is adenomatous. If  three                            or more polyps are adenomatous, will repeat                            colonoscopy in 3 years. Thornton Park MD, MD 07/13/2018 11:03:23 AM This report has been signed electronically.

## 2018-07-13 NOTE — Progress Notes (Signed)
Called to room to assist during endoscopic procedure.  Patient ID and intended procedure confirmed with present staff. Received instructions for my participation in the procedure from the performing physician.  

## 2018-07-16 ENCOUNTER — Telehealth: Payer: Self-pay | Admitting: *Deleted

## 2018-07-16 NOTE — Telephone Encounter (Signed)
  Follow up Call-  Call back number 07/13/2018  Post procedure Call Back phone  # [phone]  Permission to leave phone message Yes  Some recent data might be hidden     Patient questions:  Do you have a fever, pain , or abdominal swelling? No. Pain Score  0 *  Have you tolerated food without any problems? Yes.    Have you been able to return to your normal activities? Yes.    Do you have any questions about your discharge instructions: Diet   No. Medications  No. Follow up visit  No.  Do you have questions or concerns about your Care? No.  Actions: * If pain score is 4 or above: No action needed, pain <4.

## 2018-07-20 ENCOUNTER — Encounter: Payer: Self-pay | Admitting: Gastroenterology

## 2019-01-16 ENCOUNTER — Encounter: Payer: Self-pay | Admitting: Internal Medicine

## 2019-12-03 ENCOUNTER — Encounter: Payer: Self-pay | Admitting: Internal Medicine

## 2019-12-03 ENCOUNTER — Other Ambulatory Visit: Payer: Self-pay

## 2019-12-03 ENCOUNTER — Ambulatory Visit (INDEPENDENT_AMBULATORY_CARE_PROVIDER_SITE_OTHER): Payer: BC Managed Care – PPO | Admitting: Internal Medicine

## 2019-12-03 VITALS — BP 155/110 | HR 64 | Temp 97.3°F | Resp 16 | Ht 66.0 in | Wt 196.2 lb

## 2019-12-03 DIAGNOSIS — K648 Other hemorrhoids: Secondary | ICD-10-CM

## 2019-12-03 DIAGNOSIS — R030 Elevated blood-pressure reading, without diagnosis of hypertension: Secondary | ICD-10-CM

## 2019-12-03 LAB — COMPREHENSIVE METABOLIC PANEL
ALT: 20 U/L (ref 0–35)
AST: 17 U/L (ref 0–37)
Albumin: 4.6 g/dL (ref 3.5–5.2)
Alkaline Phosphatase: 51 U/L (ref 39–117)
BUN: 14 mg/dL (ref 6–23)
CO2: 28 mEq/L (ref 19–32)
Calcium: 9.9 mg/dL (ref 8.4–10.5)
Chloride: 103 mEq/L (ref 96–112)
Creatinine, Ser: 0.73 mg/dL (ref 0.40–1.20)
GFR: 103.46 mL/min (ref >60.00)
Glucose, Bld: 87 mg/dL (ref 70–99)
Potassium: 4.4 mEq/L (ref 3.5–5.1)
Sodium: 137 mEq/L (ref 135–145)
Total Bilirubin: 0.6 mg/dL (ref 0.2–1.2)
Total Protein: 7.4 g/dL (ref 6.0–8.3)

## 2019-12-03 LAB — CBC WITH DIFFERENTIAL/PLATELET
Basophils Absolute: 0 10*3/uL (ref 0.0–0.1)
Basophils Relative: 1 % (ref 0.0–3.0)
Eosinophils Absolute: 0.1 10*3/uL (ref 0.0–0.7)
Eosinophils Relative: 1.9 % (ref 0.0–5.0)
HCT: 43.5 % (ref 36.0–46.0)
Hemoglobin: 14.4 g/dL (ref 12.0–15.0)
Lymphocytes Relative: 42.9 % (ref 12.0–46.0)
Lymphs Abs: 1.9 10*3/uL (ref 0.7–4.0)
MCHC: 33.2 g/dL (ref 30.0–36.0)
MCV: 93 fl (ref 78.0–100.0)
Monocytes Absolute: 0.3 10*3/uL (ref 0.1–1.0)
Monocytes Relative: 7.1 % (ref 3.0–12.0)
Neutro Abs: 2.1 10*3/uL (ref 1.4–7.7)
Neutrophils Relative %: 47.1 % (ref 43.0–77.0)
Platelets: 289 10*3/uL (ref 150.0–400.0)
RBC: 4.67 Mil/uL (ref 3.87–5.11)
RDW: 13.7 % (ref 11.5–15.5)
WBC: 4.5 10*3/uL (ref 4.0–10.5)

## 2019-12-03 MED ORDER — HYDROCORTISONE ACETATE 25 MG RE SUPP: 25.0000 mg | Freq: Two times a day (BID) | RECTAL | 1 refills | Status: DC | PRN

## 2019-12-03 NOTE — Progress Notes (Signed)
Pre visit review using our clinic review tool, if applicable. No additional management support is needed unless otherwise documented below in the visit note. 

## 2019-12-03 NOTE — Progress Notes (Signed)
Subjective:    Patient ID: Maureen Spencer, female    DOB: [birth year], 49 y.o.   MRN: [id number]  DOS:  12/03/2019 Type of visit - description: Acute Patient is concerned about her blood pressure, checked it 3 days ago: 938-182 with a diastolic BP of 82. Prior to that, she went on a trip and admits to eating out eating more salt than usual.  Also, 2 days ago noted some blood on her underwear, subsequently had a bowel movement and saw some blood in the toilet paper, approximately 1 teaspoon. I happen again next day. Denies any rectal pain or itching.   BP Readings from Last 3 Encounters:  12/03/19 (!) 144/89  07/13/18 126/81  06/28/18 (!) 132/98    Review of Systems No chest pain, difficulty breathing No nausea, vomiting, diarrhea or blood in the stools No headaches No fever chills or weight loss When asked, reports menses are monthly and heavy  for 3 days at least.   Past Medical History:  Diagnosis Date  . Asthma   . ELEVATED BLOOD PRESSURE 05/20/2009  . History of abnormal cervical Pap smear   . 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 Uterine leiomyoma     Past Surgical History:  Procedure Laterality Date  . G 1 P 1    . WISDOM TOOTH EXTRACTION      Allergies as of 12/03/2019      Reactions   Penicillins    Facial swelling      Medication List       Accurate as of December 03, 2019 11:36 AM. If you have any questions, ask your nurse or doctor.        albuterol 108 (90 Base) MCG/ACT inhaler Commonly known as: VENTOLIN HFA Inhale 2 puffs into the lungs every 4 (four) hours as needed for wheezing or shortness of breath.   budesonide-formoterol 160-4.5 MCG/ACT inhaler Commonly known as: SYMBICORT Inhale 2 puffs into the lungs 2 (two) times daily.   doxycycline 100 MG tablet Commonly known as: VIBRA-TABS as needed.   multivitamin with minerals Tabs tablet Take 1 tablet by mouth daily.   tretinoin 0.05 % cream Commonly known as: RETIN-A tretinoin 0.05 %  topical cream          Objective:   Physical Exam BP (!) 144/89 (BP Location: Left Arm, Patient Position: Sitting, Cuff Size: Normal)   Pulse 64   Temp (!) 97.3 F (36.3 C) (Temporal)   Resp 16   Ht 5\' 6"  (1.676 m)   Wt 196 lb 4 oz (89 kg)   LMP 11/14/2019 (Exact Date)   SpO2 100%   BMI 31.68 kg/m  General:   Well developed, NAD, BMI noted.  HEENT:  Normocephalic . Face symmetric, atraumatic Lungs:  CTA B Normal respiratory effort, no intercostal retractions, no accessory muscle use. Heart: RRR,  no murmur.  Abdomen:  Not distended, soft, non-tender. No rebound or rigidity.   Skin: Not pale. Not jaundice Lower extremities: Trace pretibial edema bilaterally DRE: External examination normal. Sphincter tone normal, no mass, + blood found at the rectum. Anoscopy: Has a large internal hemorrhoid around 5:00.  Fresh blood found, not abundant, no mucus or stools. Neurologic:  alert & oriented X3.  Speech normal, gait appropriate for age and unassisted Psych--  Cognition and judgment appear intact.  Cooperative with normal attention span and concentration.  Behavior appropriate. No anxious or depressed appearing.     Assessment  Assessment Asthma H/o HPV, high risk, abnormal Pap Uterine leiomyoma Not on BCP (condoms) Acne   PLAN   Elevated BP, without diagnosis of hypertension: BPs at home minimally elevated (135/82) after a trip where she admits ate more salt than usual.  I rechecked the blood pressures myself: 155/110. She has a family history of hypertension Plan: Check CMP, CBC, monitor BPs, low-salt.  Reassess in 1 month Red blood per rectum: As described above, had a colonoscopy 06-2018, + internal hemorrhoids, polyps, next in 5 years. Suspect bleeding is from internal hemorrhoid, she has no pain. Recommend Anusol suppositories, stool softer. If symptoms persist more than 2 to 3 days she will call me for a GI referral, sigmoidoscopy?Marland Kitchen Heavy periods:  As described in the review of systems, follow-up by gynecology RTC 1 month  This visit occurred during the SARS-CoV-2 public health emergency.  Safety protocols were in place, including screening questions prior to the visit, additional usage of staff PPE, and extensive cleaning of exam room while observing appropriate contact time as indicated for disinfecting solutions.

## 2019-12-03 NOTE — Patient Instructions (Addendum)
Check the  blood pressure 2 or 3 times a week BP GOAL is between 110/65 and  135/85. If it is consistently higher or lower, let me know  Follow a low-salt diet  Use Anusol twice a day for 2 days then as needed If your stools are not soft, take over-the-counter stool softeners such as Colace  If the bleeding does not stop in the next 2 to 3 days or if it is severe let me know.   GO TO THE LAB : Get the blood work     [place], [place] Come back for a physical exam in 1 month   DASH Eating Plan DASH stands for "Dietary Approaches to Stop Hypertension." The DASH eating plan is a healthy eating plan that has been shown to reduce high blood pressure (hypertension). It may also reduce your risk for type 2 diabetes, heart disease, and stroke. The DASH eating plan may also help with weight loss. What are tips for following this plan?  General guidelines  Avoid eating more than 2,300 mg (milligrams) of salt (sodium) a day. If you have hypertension, you may need to reduce your sodium intake to 1,500 mg a day.  Limit alcohol intake to no more than 1 drink a day for nonpregnant women and 2 drinks a day for men. One drink equals 12 oz of beer, 5 oz of wine, or 1 oz of hard liquor.  Work with your health care provider to maintain a healthy body weight or to lose weight. Ask what an ideal weight is for you.  Get at least 30 minutes of exercise that causes your heart to beat faster (aerobic exercise) most days of the week. Activities may include walking, swimming, or biking.  Work with your health care provider or diet and nutrition specialist (dietitian) to adjust your eating plan to your individual calorie needs. Reading food labels   Check food labels for the amount of sodium per serving. Choose foods with less than 5 percent of the Daily Value of sodium. Generally, foods with less than 300 mg of sodium per serving fit into this eating plan.  To find  whole grains, look for the word "whole" as the first word in the ingredient list. Shopping  Buy products labeled as "low-sodium" or "no salt added."  Buy fresh foods. Avoid canned foods and premade or frozen meals. Cooking  Avoid adding salt when cooking. Use salt-free seasonings or herbs instead of table salt or sea salt. Check with your health care provider or pharmacist before using salt substitutes.  Do not fry foods. Cook foods using healthy methods such as baking, boiling, grilling, and broiling instead.  Cook with heart-healthy oils, such as olive, canola, soybean, or sunflower oil. Meal planning  Eat a balanced diet that includes: ? 5 or more servings of fruits and vegetables each day. At each meal, try to fill half of your plate with fruits and vegetables. ? Up to 6-8 servings of whole grains each day. ? Less than 6 oz of lean meat, poultry, or fish each day. A 3-oz serving of meat is about the same size as a deck of cards. One egg equals 1 oz. ? 2 servings of low-fat dairy each day. ? A serving of nuts, seeds, or beans 5 times each week. ? Heart-healthy fats. Healthy fats called Omega-3 fatty acids are found in foods such as flaxseeds and coldwater fish, like sardines, salmon, and mackerel.  Limit how much you  eat of the following: ? Canned or prepackaged foods. ? Food that is high in trans fat, such as fried foods. ? Food that is high in saturated fat, such as fatty meat. ? Sweets, desserts, sugary drinks, and other foods with added sugar. ? Full-fat dairy products.  Do not salt foods before eating.  Try to eat at least 2 vegetarian meals each week.  Eat more home-cooked food and less restaurant, buffet, and fast food.  When eating at a restaurant, ask that your food be prepared with less salt or no salt, if possible. What foods are recommended? The items listed may not be a complete list. Talk with your dietitian about what dietary choices are best for  you. Grains Whole-grain or whole-wheat bread. Whole-grain or whole-wheat pasta. Brown rice. [place]. Bulgur. Whole-grain and low-sodium cereals. Pita bread. Low-fat, low-sodium crackers. Whole-wheat flour tortillas. Vegetables Fresh or frozen vegetables (raw, steamed, roasted, or grilled). Low-sodium or reduced-sodium tomato and vegetable juice. Low-sodium or reduced-sodium tomato sauce and tomato paste. Low-sodium or reduced-sodium canned vegetables. Fruits All fresh, dried, or frozen fruit. Canned fruit in natural juice (without added sugar). Meat and other protein foods Skinless chicken or [place]. Ground chicken or [place]. Pork with fat trimmed off. Fish and seafood. Egg whites. Dried beans, peas, or lentils. Unsalted nuts, nut butters, and seeds. Unsalted canned beans. Lean cuts of beef with fat trimmed off. Low-sodium, lean deli meat. Dairy Low-fat (1%) or fat-free (skim) milk. Fat-free, low-fat, or reduced-fat cheeses. Nonfat, low-sodium ricotta or cottage cheese. Low-fat or nonfat yogurt. Low-fat, low-sodium cheese. Fats and oils Soft margarine without trans fats. Vegetable oil. Low-fat, reduced-fat, or light mayonnaise and salad dressings (reduced-sodium). Canola, safflower, olive, soybean, and sunflower oils. Avocado. Seasoning and other foods Herbs. Spices. Seasoning mixes without salt. Unsalted popcorn and pretzels. Fat-free sweets. What foods are not recommended? The items listed may not be a complete list. Talk with your dietitian about what dietary choices are best for you. Grains Baked goods made with fat, such as croissants, muffins, or some breads. Dry pasta or rice meal packs. Vegetables Creamed or fried vegetables. Vegetables in a cheese sauce. Regular canned vegetables (not low-sodium or reduced-sodium). Regular canned tomato sauce and paste (not low-sodium or reduced-sodium). Regular tomato and vegetable juice (not low-sodium or reduced-sodium). Angie Fava.  Olives. Fruits Canned fruit in a light or heavy syrup. Fried fruit. Fruit in cream or butter sauce. Meat and other protein foods Fatty cuts of meat. Ribs. Fried meat. Berniece Salines. Sausage. Bologna and other processed lunch meats. Salami. Fatback. Hotdogs. Bratwurst. Salted nuts and seeds. Canned beans with added salt. Canned or smoked fish. Whole eggs or egg yolks. Chicken or [place] with skin. Dairy Whole or 2% milk, cream, and half-and-half. Whole or full-fat cream cheese. Whole-fat or sweetened yogurt. Full-fat cheese. Nondairy creamers. Whipped toppings. Processed cheese and cheese spreads. Fats and oils Butter. Stick margarine. Lard. Shortening. Ghee. Bacon fat. Tropical oils, such as coconut, palm kernel, or palm oil. Seasoning and other foods Salted popcorn and pretzels. Onion salt, garlic salt, seasoned salt, table salt, and sea salt. Worcestershire sauce. Tartar sauce. Barbecue sauce. Teriyaki sauce. Soy sauce, including reduced-sodium. Steak sauce. Canned and packaged gravies. Fish sauce. Oyster sauce. Cocktail sauce. Horseradish that you find on the shelf. Ketchup. Mustard. Meat flavorings and tenderizers. Bouillon cubes. Hot sauce and Tabasco sauce. Premade or packaged marinades. Premade or packaged taco seasonings. Relishes. Regular salad dressings. Where to find more information:  National Heart, Lung, and Blood Institute: https://wilson-eaton.com/  American  Heart Association: www.heart.org Summary  The DASH eating plan is a healthy eating plan that has been shown to reduce high blood pressure (hypertension). It may also reduce your risk for type 2 diabetes, heart disease, and stroke.  With the DASH eating plan, you should limit salt (sodium) intake to 2,300 mg a day. If you have hypertension, you may need to reduce your sodium intake to 1,500 mg a day.  When on the DASH eating plan, aim to eat more fresh fruits and vegetables, whole grains, lean proteins, low-fat dairy, and heart-healthy  fats.  Work with your health care provider or diet and nutrition specialist (dietitian) to adjust your eating plan to your individual calorie needs. This information is not intended to replace advice given to you by your health care provider. Make sure you discuss any questions you have with your health care provider. Document Revised: 05/12/2017 Document Reviewed: 05/23/2016 Elsevier Patient Education  2020 [employer].

## 2019-12-04 NOTE — Assessment & Plan Note (Signed)
Elevated BP, without diagnosis of hypertension: BPs at home minimally elevated (135/82) after a trip where she admits ate more salt than usual.  I rechecked the blood pressures myself: 155/110. She has a family history of hypertension Plan: Check CMP, CBC, monitor BPs, low-salt.  Reassess in 1 month Red blood per rectum: As described above, had a colonoscopy 06-2018, + internal hemorrhoids, polyps, next in 5 years. Suspect bleeding is from internal hemorrhoid, she has no pain. Recommend Anusol suppositories, stool softer. If symptoms persist more than 2 to 3 days she will call me for a GI referral, sigmoidoscopy?Marland Kitchen Heavy periods: As described in the review of systems, follow-up by gynecology RTC 1 month

## 2019-12-05 ENCOUNTER — Ambulatory Visit: Payer: BLUE CROSS/BLUE SHIELD | Admitting: Internal Medicine

## 2019-12-27 ENCOUNTER — Encounter: Payer: Self-pay | Admitting: Internal Medicine

## 2020-01-06 ENCOUNTER — Other Ambulatory Visit: Payer: Self-pay | Admitting: Obstetrics & Gynecology

## 2020-01-06 DIAGNOSIS — Z1231 Encounter for screening mammogram for malignant neoplasm of breast: Secondary | ICD-10-CM

## 2020-01-13 ENCOUNTER — Other Ambulatory Visit: Payer: Self-pay

## 2020-01-13 ENCOUNTER — Ambulatory Visit (INDEPENDENT_AMBULATORY_CARE_PROVIDER_SITE_OTHER): Payer: BC Managed Care – PPO | Admitting: Internal Medicine

## 2020-01-13 ENCOUNTER — Encounter: Payer: Self-pay | Admitting: Internal Medicine

## 2020-01-13 VITALS — BP 138/96 | HR 87 | Temp 98.2°F | Resp 16 | Ht 66.0 in | Wt 200.1 lb

## 2020-01-13 DIAGNOSIS — D259 Leiomyoma of uterus, unspecified: Secondary | ICD-10-CM | POA: Diagnosis not present

## 2020-01-13 DIAGNOSIS — N912 Amenorrhea, unspecified: Secondary | ICD-10-CM | POA: Diagnosis not present

## 2020-01-13 DIAGNOSIS — Z0000 Encounter for general adult medical examination without abnormal findings: Secondary | ICD-10-CM

## 2020-01-13 NOTE — Progress Notes (Signed)
Pre visit review using our clinic review tool, if applicable. No additional management support is needed unless otherwise documented below in the visit note. 

## 2020-01-13 NOTE — Progress Notes (Signed)
Subjective:    Patient ID: Maureen Spencer, female    DOB: May 31, 1971, 49 y.o.   MRN: [id number]  DOS:  01/13/2020 Type of visit - description: CPX. In addition to CPX, we also talk about other issues including amenorrhea LMP June 3, on the first week of July before she had some cramps but no bleeding. Denies dysuria, gross hematuria, spotting. No nausea or vomiting.   Review of Systems  Other than above, a 14 point review of systems is negative      Past Medical History:  Diagnosis Date  . Asthma   . ELEVATED BLOOD PRESSURE 05/20/2009  . History of abnormal cervical Pap smear   . HPV in female 43   High risk  . Uterine leiomyoma     Past Surgical History:  Procedure Laterality Date  . G 1 P 1    . WISDOM TOOTH EXTRACTION      Allergies as of 01/13/2020      Reactions   Penicillins    Facial swelling      Medication List       Accurate as of January 13, 2020 11:59 PM. If you have any questions, ask your nurse or doctor.        albuterol 108 (90 Base) MCG/ACT inhaler Commonly known as: VENTOLIN HFA Inhale 2 puffs into the lungs every 4 (four) hours as needed for wheezing or shortness of breath.   budesonide-formoterol 160-4.5 MCG/ACT inhaler Commonly known as: SYMBICORT Inhale 2 puffs into the lungs 2 (two) times daily.   hydrocortisone 25 MG suppository Commonly known as: ANUSOL-HC Place 1 suppository (25 mg total) rectally 2 (two) times daily as needed for hemorrhoids.   multivitamin with minerals Tabs tablet Take 1 tablet by mouth daily.          Objective:   Physical Exam Abdominal:      BP (!) 138/96 (BP Location: Left Arm, Patient Position: Sitting, Cuff Size: Normal)   Pulse 87   Temp 98.2 F (36.8 C) (Oral)   Resp 16   Ht 5\' 6"  (1.676 m)   Wt 200 lb 2 oz (90.8 kg)   LMP 11/14/2019 (Exact Date)   SpO2 98%   BMI 32.30 kg/m  General: Well developed, NAD, BMI noted Neck: No  thyromegaly  HEENT:  Normocephalic . Face symmetric,  atraumatic Lungs:  CTA B Normal respiratory effort, no intercostal retractions, no accessory muscle use. Heart: RRR,  no murmur.  Abdomen:  Not distended, soft, non-tender. No rebound or rigidity.  See graphic. Lower extremities: no pretibial edema bilaterally  Skin: Exposed areas without rash. Not pale. Not jaundice Neurologic:  alert & oriented X3.  Speech normal, gait appropriate for age and unassisted Strength symmetric and appropriate for age.  Psych: Cognition and judgment appear intact.  Cooperative with normal attention span and concentration.  Behavior appropriate. No anxious or depressed appearing.     Assessment        Assessment Asthma H/o HPV, high risk, abnormal Pap Uterine leiomyoma Not on BCP (condoms) Acne   PLAN   Here for CPX Elevated BP: BP today is elevated, at home has checked several times and is consistently in the 120s over 80s.  Recommend to continue monitoring. Amenorrhea: LMP June 3, she wonders about menopause, request further evaluation until she sees her gynecologist in a couple of weeks.  We will check beta hCG, FSH and LH. Uterine leiomyomas: Has a mass at the pelvis, suspect related to leiomyomas, will get  pelvic & TV ultrasound.  She is asymptomatic. Hair loss: Was recommended iron by her hematologist, check a TIBC.  Last hemoglobin normal.  She did have heavy menses until recently. Red blood per rectum: See last visit, resolved RTC 1 year     This visit occurred during the SARS-CoV-2 public health emergency.  Safety protocols were in place, including screening questions prior to the visit, additional usage of staff PPE, and extensive cleaning of exam room while observing appropriate contact time as indicated for disinfecting solutions.

## 2020-01-13 NOTE — Patient Instructions (Addendum)
Happy early Rudene Anda!  Check the  blood pressure regularly  BP GOAL is between 110/65 and  135/85. If it is consistently higher or lower, let me know     GO TO THE LAB : Get the blood work     [place], [place] Come back for   physical exam in 1 year   STOP BY THE FIRST FLOOR: Schedule the pelvic and transvaginal ultrasound

## 2020-01-14 ENCOUNTER — Telehealth: Payer: Self-pay

## 2020-01-14 ENCOUNTER — Encounter: Payer: Self-pay | Admitting: Internal Medicine

## 2020-01-14 NOTE — Telephone Encounter (Signed)
Spoke w/ Pt- she forgot to stop by lab yesterday during visit. She has her mammogram and pelvic ultrasound scheduled downstairs tomorrow afternoon. Lab appt scheduled for tomorrow as well, she is aware to come by office prior to imaging.

## 2020-01-14 NOTE — Assessment & Plan Note (Signed)
-  Td  2015 -Had Covid vaccinations -Recommend flu shot q. year -FH colon ca, F dx age 49;  colonoscopy 07/13/2018, next per GI -female care: per gyn, appointment in approximately 2 weeks.  Has a mammogram scheduled and pending. -Labs: Beta hCG, FSH, LH, FLP, TIBC. -Diet exercise discussed.

## 2020-01-14 NOTE — Assessment & Plan Note (Signed)
Here for CPX Elevated BP: BP today is elevated, at home has checked several times and is consistently in the 120s over 80s.  Recommend to continue monitoring. Amenorrhea: LMP June 3, she wonders about menopause, request further evaluation until she sees her gynecologist in a couple of weeks.  We will check beta hCG, FSH and LH. Uterine leiomyomas: Has a mass at the pelvis, suspect related to leiomyomas, will get pelvic & TV ultrasound.  She is asymptomatic. Hair loss: Was recommended iron by her hematologist, check a TIBC.  Last hemoglobin normal.  She did have heavy menses until recently. Red blood per rectum: See last visit, resolved RTC 1 year

## 2020-01-15 ENCOUNTER — Ambulatory Visit: Payer: BC Managed Care – PPO

## 2020-01-15 ENCOUNTER — Other Ambulatory Visit: Payer: Self-pay

## 2020-01-15 ENCOUNTER — Other Ambulatory Visit (INDEPENDENT_AMBULATORY_CARE_PROVIDER_SITE_OTHER): Payer: BC Managed Care – PPO

## 2020-01-15 ENCOUNTER — Ambulatory Visit (HOSPITAL_BASED_OUTPATIENT_CLINIC_OR_DEPARTMENT_OTHER)
Admission: RE | Admit: 2020-01-15 | Discharge: 2020-01-15 | Disposition: A | Discharge status: Home / Self Care | Payer: BC Managed Care – PPO | Source: Ambulatory Visit | Attending: Obstetrics & Gynecology | Admitting: Obstetrics & Gynecology

## 2020-01-15 ENCOUNTER — Ambulatory Visit (HOSPITAL_BASED_OUTPATIENT_CLINIC_OR_DEPARTMENT_OTHER)
Admission: RE | Admit: 2020-01-15 | Discharge: 2020-01-15 | Disposition: A | Discharge status: Home / Self Care | Payer: BC Managed Care – PPO | Source: Ambulatory Visit | Attending: Internal Medicine | Admitting: Internal Medicine

## 2020-01-15 DIAGNOSIS — N912 Amenorrhea, unspecified: Secondary | ICD-10-CM

## 2020-01-15 DIAGNOSIS — Z1231 Encounter for screening mammogram for malignant neoplasm of breast: Secondary | ICD-10-CM | POA: Insufficient documentation

## 2020-01-15 DIAGNOSIS — D259 Leiomyoma of uterus, unspecified: Secondary | ICD-10-CM | POA: Diagnosis not present

## 2020-01-15 DIAGNOSIS — Z0000 Encounter for general adult medical examination without abnormal findings: Secondary | ICD-10-CM

## 2020-01-16 LAB — LIPID PANEL
Cholesterol: 163 mg/dL (ref 0–200)
HDL: 51.3 mg/dL (ref >39.00)
LDL Cholesterol: 98 mg/dL (ref 0–99)
NonHDL: 111.61
Total CHOL/HDL Ratio: 3
Triglycerides: 68 mg/dL (ref 0.0–149.0)
VLDL: 13.6 mg/dL (ref 0.0–40.0)

## 2020-01-16 LAB — LUTEINIZING HORMONE: LH: 56.01 m[IU]/mL

## 2020-01-16 LAB — HCG, QUANTITATIVE, PREGNANCY: Quantitative HCG: 0.99 m[IU]/mL

## 2020-01-16 LAB — IBC + FERRITIN
Ferritin: 28.1 ng/mL (ref 10.0–291.0)
Iron: 150 ug/dL — ABNORMAL HIGH (ref 42–145)
Saturation Ratios: 46.6 % (ref 20.0–50.0)
Transferrin: 230 mg/dL (ref 212.0–360.0)

## 2020-01-16 LAB — FOLLICLE STIMULATING HORMONE: FSH: 32.1 m[IU]/mL

## 2020-01-27 LAB — HM PAP SMEAR: HM Pap smear: NEGATIVE

## 2020-03-05 ENCOUNTER — Other Ambulatory Visit: Payer: Self-pay | Admitting: Internal Medicine

## 2020-05-30 ENCOUNTER — Ambulatory Visit: Payer: BC Managed Care – PPO | Attending: Internal Medicine

## 2020-05-30 DIAGNOSIS — Z23 Encounter for immunization: Secondary | ICD-10-CM

## 2020-05-30 NOTE — Progress Notes (Signed)
   Covid-19 Vaccination Clinic  Name:  MIYEKO MAHLUM    MRN: [id number] DOB: 1970-12-13  05/30/2020  Ms. Sarracino was observed post Covid-19 immunization for 15 minutes without incident. She was provided with Vaccine Information Sheet and instruction to access the V-Safe system.   Ms. Hoke was instructed to call 911 with any severe reactions post vaccine: Marland Kitchen Difficulty breathing  . Swelling of face and throat  . A fast heartbeat  . A bad rash all over body  . Dizziness and weakness   Immunizations Administered    Name Date Dose VIS Date Route   Pfizer COVID-19 Vaccine 05/30/2020 10:20 AM 0.3 mL 04/01/2020 Intramuscular   Manufacturer: [place]   Lot: [id number]   [place]: [id number]

## 2020-09-08 ENCOUNTER — Encounter: Payer: Self-pay | Admitting: Internal Medicine

## 2020-11-27 ENCOUNTER — Ambulatory Visit: Payer: BC Managed Care – PPO | Admitting: Internal Medicine

## 2021-05-17 ENCOUNTER — Other Ambulatory Visit: Payer: Self-pay | Admitting: Obstetrics & Gynecology

## 2021-05-17 DIAGNOSIS — Z1231 Encounter for screening mammogram for malignant neoplasm of breast: Secondary | ICD-10-CM

## 2021-05-18 ENCOUNTER — Ambulatory Visit
Admission: RE | Admit: 2021-05-18 | Discharge: 2021-05-18 | Disposition: A | Discharge status: Home / Self Care | Payer: BC Managed Care – PPO | Source: Ambulatory Visit | Attending: Obstetrics & Gynecology | Admitting: Obstetrics & Gynecology

## 2021-05-18 DIAGNOSIS — Z1231 Encounter for screening mammogram for malignant neoplasm of breast: Secondary | ICD-10-CM

## 2021-06-04 ENCOUNTER — Encounter: Payer: Self-pay | Admitting: Internal Medicine

## 2021-06-04 ENCOUNTER — Ambulatory Visit (INDEPENDENT_AMBULATORY_CARE_PROVIDER_SITE_OTHER): Payer: BC Managed Care – PPO | Admitting: Internal Medicine

## 2021-06-04 ENCOUNTER — Other Ambulatory Visit: Payer: Self-pay | Admitting: Internal Medicine

## 2021-06-04 VITALS — BP 126/80 | HR 69 | Temp 98.1°F | Resp 16 | Ht 66.0 in | Wt 198.0 lb

## 2021-06-04 DIAGNOSIS — Z1159 Encounter for screening for other viral diseases: Secondary | ICD-10-CM | POA: Diagnosis not present

## 2021-06-04 DIAGNOSIS — Z23 Encounter for immunization: Secondary | ICD-10-CM

## 2021-06-04 DIAGNOSIS — Z0000 Encounter for general adult medical examination without abnormal findings: Secondary | ICD-10-CM

## 2021-06-04 DIAGNOSIS — M729 Fibroblastic disorder, unspecified: Secondary | ICD-10-CM

## 2021-06-04 LAB — LIPID PANEL
Cholesterol: 153 mg/dL (ref 0–200)
HDL: 42.5 mg/dL (ref >39.00)
LDL Cholesterol: 92 mg/dL (ref 0–99)
NonHDL: 110.3
Total CHOL/HDL Ratio: 4
Triglycerides: 90 mg/dL (ref 0.0–149.0)
VLDL: 18 mg/dL (ref 0.0–40.0)

## 2021-06-04 LAB — CBC WITH DIFFERENTIAL/PLATELET
Basophils Absolute: 0.1 10*3/uL (ref 0.0–0.1)
Basophils Relative: 1.7 % (ref 0.0–3.0)
Eosinophils Absolute: 0.1 10*3/uL (ref 0.0–0.7)
Eosinophils Relative: 2 % (ref 0.0–5.0)
HCT: 37.2 % (ref 36.0–46.0)
Hemoglobin: 12.2 g/dL (ref 12.0–15.0)
Lymphocytes Relative: 50.1 % — ABNORMAL HIGH (ref 12.0–46.0)
Lymphs Abs: 1.9 10*3/uL (ref 0.7–4.0)
MCHC: 32.8 g/dL (ref 30.0–36.0)
MCV: 89.3 fl (ref 78.0–100.0)
Monocytes Absolute: 0.3 10*3/uL (ref 0.1–1.0)
Monocytes Relative: 7 % (ref 3.0–12.0)
Neutro Abs: 1.5 10*3/uL (ref 1.4–7.7)
Neutrophils Relative %: 39.2 % — ABNORMAL LOW (ref 43.0–77.0)
Platelets: 306 10*3/uL (ref 150.0–400.0)
RBC: 4.16 Mil/uL (ref 3.87–5.11)
RDW: 15.5 % (ref 11.5–15.5)
WBC: 3.9 10*3/uL — ABNORMAL LOW (ref 4.0–10.5)

## 2021-06-04 LAB — COMPREHENSIVE METABOLIC PANEL
ALT: 15 U/L (ref 0–35)
AST: 15 U/L (ref 0–37)
Albumin: 4.1 g/dL (ref 3.5–5.2)
Alkaline Phosphatase: 49 U/L (ref 39–117)
BUN: 13 mg/dL (ref 6–23)
CO2: 29 mEq/L (ref 19–32)
Calcium: 9.2 mg/dL (ref 8.4–10.5)
Chloride: 104 mEq/L (ref 96–112)
Creatinine, Ser: 0.78 mg/dL (ref 0.40–1.20)
GFR: 89.84 mL/min (ref >60.00)
Glucose, Bld: 77 mg/dL (ref 70–99)
Potassium: 4.4 mEq/L (ref 3.5–5.1)
Sodium: 140 mEq/L (ref 135–145)
Total Bilirubin: 0.5 mg/dL (ref 0.2–1.2)
Total Protein: 7 g/dL (ref 6.0–8.3)

## 2021-06-04 LAB — FERRITIN: Ferritin: 8.4 ng/mL — ABNORMAL LOW (ref 10.0–291.0)

## 2021-06-04 LAB — TSH: TSH: 1.72 u[IU]/mL (ref 0.35–5.50)

## 2021-06-04 LAB — IRON: Iron: 56 ug/dL (ref 42–145)

## 2021-06-04 MED ORDER — ALBUTEROL SULFATE HFA 108 (90 BASE) MCG/ACT IN AERS: 2.0000 | INHALATION_SPRAY | RESPIRATORY_TRACT | 5 refills | Status: DC | PRN

## 2021-06-04 MED ORDER — HYDROCORTISONE 2.5 % EX CREA: TOPICAL_CREAM | Freq: Two times a day (BID) | CUTANEOUS | 3 refills | Status: DC | PRN

## 2021-06-04 NOTE — Patient Instructions (Signed)
Proceed with a COVID-vaccine at your earliest convenience  GO TO THE LAB : Get the blood work     [place], [place] Come back for a physical exam in 1 year

## 2021-06-04 NOTE — Progress Notes (Signed)
Subjective:    Patient ID: Maureen Spencer, female    DOB: May 24, 1971, 50 y.o.   MRN: [id number]  DOS:  06/04/2021 Type of visit - description: cpx  Since the last office visit she is doing well. Has no major concerns.   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  Tobacco Use   Smoking status: Never   Smokeless tobacco: Never  Substance and Sexual Activity   Alcohol use: Yes    Comment: Socially   Drug use: No   Sexual activity: Never    Birth control/protection: Abstinence  Other Topics Concern   Not on file  Social History Narrative   Divorced   son 43   Parents in [place]       Social Determinants of Health   Financial Resource Strain: Not on file  Food Insecurity: Not on file  Transportation Needs: Not on file  Physical Activity: Not on file  Stress: Not on file  Social Connections: Not on file  Intimate Partner Violence: Not on file      Allergies as of 06/04/2021       Reactions   Penicillins    Facial swelling        Medication List        Accurate as of June 04, 2021 11:59 PM. If you have any questions, ask your nurse or doctor.          STOP taking these medications    budesonide-formoterol 160-4.5 MCG/ACT inhaler Commonly known as: SYMBICORT Stopped by: Kathlene November, MD   fluocinonide cream 0.05 % Commonly known as: [place] by: Kathlene November, MD   hydrocortisone 25  MG suppository Commonly known as: ANUSOL-HC Stopped by: Kathlene November, MD   multivitamin with minerals Tabs tablet Stopped by: Kathlene November, MD       TAKE these medications    albuterol 108 (90 Base) MCG/ACT inhaler Commonly known as: ProAir HFA Inhale 2 puffs into the lungs every 4 (four) hours as needed for wheezing or shortness of breath.   hydrocortisone 2.5 % cream Apply topically 2 (two) times daily as needed. Started by: Kathlene November, MD           Objective:   Physical Exam BP 126/80 (BP Location: Left Arm, Patient Position: Sitting, Cuff Size: Small)    Pulse 69    Temp 98.1 F (36.7 C) (Oral)    Resp 16    Ht 5\' 6"  (1.676 m)    Wt 198 lb (89.8 kg)    SpO2 98%    BMI 31.96 kg/m  General: Well developed, NAD, BMI noted Neck: No  thyromegaly  HEENT:  Normocephalic . Face symmetric, atraumatic Lungs:  CTA B Normal respiratory effort, no intercostal retractions, no  accessory muscle use. Heart: RRR,  no murmur.  Abdomen:  Not distended, soft, non-tender. No rebound or rigidity.   Lower extremities: no pretibial edema bilaterally  Skin: Skin at the dorsum of the hand is slightly dry Neurologic:  alert & oriented X3.  Speech normal, gait appropriate for age and unassisted Strength symmetric and appropriate for age.  Psych: Cognition and judgment appear intact.  Cooperative with normal attention span and concentration.  Behavior appropriate. No anxious or depressed appearing.     Assessment    Assessment Asthma Eczema (hands) H/o HPV, high risk, abnormal Pap Uterine leiomyoma Not on BCP , perimenopausal as off 05-2021    PLAN   Here for CPX Asthma: Uses albuterol rarely, RF sent. Eczema: At the hands, recommend to use moisturizing creams frequently and  hydrocortisone 2.5% sparingly. Ambulatory BPs: All normal.  Typically elevated only when she is stressed. RTC 1 year   This visit occurred during the SARS-CoV-2 public health emergency.  Safety protocols were  in place, including screening questions prior to the visit, additional usage of staff PPE, and extensive cleaning of exam room while observing appropriate contact time as indicated for disinfecting solutions.

## 2021-06-07 ENCOUNTER — Encounter: Payer: Self-pay | Admitting: Internal Medicine

## 2021-06-07 NOTE — Assessment & Plan Note (Signed)
-  Td  2015 - Covid vax rec bivalent booster  - flu shot today  -FH colon ca, F dx age 50;  colonoscopy 07/13/2018, next per GI -female care: per gyn .  - MMG few weeks ago per pt    -Labs: CMP, FLP, CBC, iron, TSH, hep C -Diet exercise discussed but she is doing well

## 2021-06-07 NOTE — Assessment & Plan Note (Signed)
Here for CPX Asthma: Uses albuterol rarely, RF sent. Eczema: At the hands, recommend to use moisturizing creams frequently and  hydrocortisone 2.5% sparingly. Ambulatory BPs: All normal.  Typically elevated only when she is stressed. RTC 1 year

## 2021-06-08 LAB — HEPATITIS C ANTIBODY
Hepatitis C Ab: NONREACTIVE
SIGNAL TO CUT-OFF: 0.07 (ref <1.00)

## 2022-07-25 ENCOUNTER — Other Ambulatory Visit: Payer: Self-pay | Admitting: Obstetrics & Gynecology

## 2022-07-25 DIAGNOSIS — Z1231 Encounter for screening mammogram for malignant neoplasm of breast: Secondary | ICD-10-CM

## 2022-09-07 ENCOUNTER — Ambulatory Visit
Admission: RE | Admit: 2022-09-07 | Discharge: 2022-09-07 | Disposition: A | Discharge status: Home / Self Care | Payer: Managed Care, Other (non HMO) | Source: Ambulatory Visit | Attending: Obstetrics & Gynecology | Admitting: Obstetrics & Gynecology

## 2022-09-07 DIAGNOSIS — Z1231 Encounter for screening mammogram for malignant neoplasm of breast: Secondary | ICD-10-CM

## 2022-10-04 ENCOUNTER — Ambulatory Visit (INDEPENDENT_AMBULATORY_CARE_PROVIDER_SITE_OTHER): Payer: Managed Care, Other (non HMO) | Admitting: Internal Medicine

## 2022-10-04 ENCOUNTER — Encounter: Payer: Self-pay | Admitting: Internal Medicine

## 2022-10-04 VITALS — BP 120/84 | HR 80 | Temp 97.9°F | Resp 16 | Ht 66.0 in | Wt 187.4 lb

## 2022-10-04 DIAGNOSIS — Z01419 Encounter for gynecological examination (general) (routine) without abnormal findings: Secondary | ICD-10-CM

## 2022-10-04 DIAGNOSIS — D509 Iron deficiency anemia, unspecified: Secondary | ICD-10-CM

## 2022-10-04 DIAGNOSIS — Z0000 Encounter for general adult medical examination without abnormal findings: Secondary | ICD-10-CM | POA: Diagnosis not present

## 2022-10-04 NOTE — Patient Instructions (Addendum)
We are referring you to a gynecologist.  If you do not hear from them in the next few days please let us know   GO TO THE LAB : Get the blood work     GO TO THE FRONT DESK, PLEASE SCHEDULE YOUR APPOINTMENTS Come back for a physical exam in 1 year

## 2022-10-04 NOTE — Progress Notes (Unsigned)
   Subjective:    Patient ID: Maureen Spencer, female    DOB: 1970-07-17, 52 y.o.   MRN: [id number]  DOS:  10/04/2022 Type of visit - description: cpx  Since the last office visit is doing well. Still has some hot flashes. Menses are irregular, often times heavy and unpredictable.  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   Uterine leiomyoma     Past Surgical History:  Procedure Laterality Date   G 1 P 1     WISDOM TOOTH EXTRACTION      Current Outpatient Medications  Medication Instructions   albuterol (PROAIR HFA) 108 (90 Base) MCG/ACT inhaler 2 puffs, Inhalation, Every 4 hours PRN   Ascorbic Acid (VITAMIN C PO) Vitamin C  DAILY   BIOTIN PO Oral   Multiple Vitamins-Iron (ONE DAILY/IRON PO) iron  DAILY       Objective:   Physical Exam BP 120/84   Pulse 80   Temp 97.9 F (36.6 C) (Oral)   Resp 16   Ht  (1.676 m)   Wt 187 lb 6 oz (85 kg)   SpO2 97%   BMI 30.24 kg/m  General: Well developed, NAD, BMI noted Neck: No  thyromegaly  HEENT:  Normocephalic . Face symmetric, atraumatic Lungs:  CTA B Normal respiratory effort, no intercostal retractions, no accessory muscle use. Heart: RRR,  no murmur.  Abdomen:  Not distended, soft, non-tender. No rebound or rigidity.   Lower extremities: no pretibial edema bilaterally  Skin: Exposed areas without rash. Not pale. Not jaundice Neurologic:  alert & oriented X3.  Speech normal, gait appropriate for age and unassisted Strength symmetric and appropriate for age.  Psych: Cognition and judgment appear intact.  Cooperative with normal attention span and concentration.  Behavior appropriate. No anxious or depressed appearing.     Assessment     Assessment Asthma Eczema (hands) H/o HPV, high risk, abnormal Pap Uterine leiomyoma Not on BCP ,  perimenopausal as off 05-2021    PLAN   Here for CPX Asthma: Inactive for a while. Eczema: At the hands and sometimes at the neck.  Neck itching and dryness worse after sweating.  Plans to see dermatology. Right leg pain: Occasionally has a pain/numbness at the lateral side of the right leg, symptoms are mild self resolved quickly.  Observation. IDA: Iron deficiency, on OTC iron 27 mg daily.  No GI symptoms, kidney bleeding on and off.  Checking labs.  Up-to-date on colon cancer screening.   RTC 1 year  - Td  2015 - Covid vax   booster is an option -FH colon ca, F dx age 62;  colonoscopy 07/13/2018, next  2025 per GI letter  -female care:  needs a new gyn, will refer . - +FH breast ca MMG done 08-2022     -Labs: CMP FLP CBC iron profile - The patient gained  weight but is doing great in the last few months has lost weight.  Exercises daily and watches her diet.      This visit occurred during the SARS-CoV-2 public health emergency.  Safety protocols were in place, including screening questions prior to the visit, additional usage of staff PPE, and extensive cleaning of exam room while observing appropriate contact time as indicated for disinfecting solutions.

## 2022-10-05 ENCOUNTER — Encounter: Payer: Self-pay | Admitting: Internal Medicine

## 2022-10-05 LAB — COMPREHENSIVE METABOLIC PANEL
ALT: 22 IU/L (ref 0–32)
AST: 16 IU/L (ref 0–40)
Albumin/Globulin Ratio: 1.7 (ref 1.2–2.2)
Albumin: 4.4 g/dL (ref 3.9–4.9)
Alkaline Phosphatase: 57 IU/L (ref 44–121)
BUN/Creatinine Ratio: 17 (ref 9–23)
BUN: 15 mg/dL (ref 6–24)
Bilirubin Total: 0.5 mg/dL (ref 0.0–1.2)
CO2: 20 mmol/L (ref 20–29)
Calcium: 9.2 mg/dL (ref 8.7–10.2)
Chloride: 104 mmol/L (ref 96–106)
Creatinine, Ser: 0.89 mg/dL (ref 0.57–1.00)
Globulin, Total: 2.6 g/dL (ref 1.5–4.5)
Glucose: 87 mg/dL (ref 70–99)
Potassium: 4.4 mmol/L (ref 3.5–5.2)
Sodium: 139 mmol/L (ref 134–144)
Total Protein: 7 g/dL (ref 6.0–8.5)
eGFR: 79 mL/min/{1.73_m2} (ref >59)

## 2022-10-05 LAB — CBC WITH DIFFERENTIAL/PLATELET
Basophils Absolute: 0 10*3/uL (ref 0.0–0.2)
Basos: 1 %
EOS (ABSOLUTE): 0.1 10*3/uL (ref 0.0–0.4)
Eos: 3 %
Hematocrit: 42.6 % (ref 34.0–46.6)
Hemoglobin: 13.9 g/dL (ref 11.1–15.9)
Immature Grans (Abs): 0 10*3/uL (ref 0.0–0.1)
Immature Granulocytes: 1 %
Lymphocytes Absolute: 2.5 10*3/uL (ref 0.7–3.1)
Lymphs: 54 %
MCH: 30.2 pg (ref 26.6–33.0)
MCHC: 32.6 g/dL (ref 31.5–35.7)
MCV: 93 fL (ref 79–97)
Monocytes Absolute: 0.3 10*3/uL (ref 0.1–0.9)
Monocytes: 8 %
Neutrophils Absolute: 1.5 10*3/uL (ref 1.4–7.0)
Neutrophils: 33 %
Platelets: 273 10*3/uL (ref 150–450)
RBC: 4.6 x10E6/uL (ref 3.77–5.28)
RDW: 12.6 % (ref 11.7–15.4)
WBC: 4.5 10*3/uL (ref 3.4–10.8)

## 2022-10-05 LAB — IRON,TIBC AND FERRITIN PANEL
Ferritin: 39 ng/mL (ref 15–150)
Iron Saturation: 31 % (ref 15–55)
Iron: 83 ug/dL (ref 27–159)
Total Iron Binding Capacity: 264 ug/dL (ref 250–450)
UIBC: 181 ug/dL (ref 131–425)

## 2022-10-05 LAB — LIPID PANEL
Chol/HDL Ratio: 3.2 ratio (ref 0.0–4.4)
Cholesterol, Total: 160 mg/dL (ref 100–199)
HDL: 50 mg/dL
LDL Chol Calc (NIH): 96 mg/dL (ref 0–99)
Triglycerides: 70 mg/dL (ref 0–149)
VLDL Cholesterol Cal: 14 mg/dL (ref 5–40)

## 2022-10-05 NOTE — Assessment & Plan Note (Signed)
Here for CPX Asthma: Inactive for a while. Eczema: At the hands and sometimes at the neck.  Neck itching and dryness worse after sweating.  Plans to see dermatology. Right leg pain: Occasionally has a pain/numbness at the lateral side of the right leg, symptoms are mild self resolved quickly.  Observation. IDA: Iron deficiency, on OTC iron 27 mg daily.  No GI symptoms. Up-to-date on colon cancer screening.  Labs RTC 1 year

## 2022-10-05 NOTE — Assessment & Plan Note (Signed)
-   Td  2015 - Covid vax   booster is an option -FH colon ca, F dx age 52;  colonoscopy 07/13/2018, next  2025 per GI letter  -female care:  needs a new gyn, will refer . - +FH breast ca MMG done 08-2022     -Labs: CMP FLP CBC iron profile - The patient gained  weight but is doing great in the last few months . Exercises daily and watches her diet.

## 2022-10-08 ENCOUNTER — Encounter: Payer: Self-pay | Admitting: Internal Medicine

## 2022-10-09 NOTE — Telephone Encounter (Signed)
Ok to RF? 

## 2022-10-10 ENCOUNTER — Other Ambulatory Visit: Payer: Self-pay | Admitting: Internal Medicine

## 2022-10-10 MED ORDER — ALBUTEROL SULFATE HFA 108 (90 BASE) MCG/ACT IN AERS: 2.0000 | INHALATION_SPRAY | RESPIRATORY_TRACT | 5 refills | Status: DC | PRN

## 2022-10-10 NOTE — Addendum Note (Signed)
Addended byConrad Vina D on: 10/10/2022 07:47 AM   Modules accepted: Orders

## 2022-10-26 ENCOUNTER — Telehealth: Payer: Self-pay

## 2022-10-26 NOTE — Telephone Encounter (Signed)
Called patient to schedule new patient appointment. Left voicemail with our contact information to call back and schedule.  

## 2022-12-02 LAB — HM PAP SMEAR: HM Pap smear: NEGATIVE

## 2022-12-05 ENCOUNTER — Encounter: Payer: Self-pay | Admitting: Internal Medicine

## 2022-12-07 ENCOUNTER — Encounter: Payer: Self-pay | Admitting: Internal Medicine

## 2022-12-13 ENCOUNTER — Encounter: Payer: Self-pay | Admitting: Internal Medicine

## 2022-12-13 ENCOUNTER — Telehealth: Payer: Self-pay

## 2022-12-13 NOTE — Telephone Encounter (Signed)
Received fax confirmation

## 2022-12-13 NOTE — Telephone Encounter (Signed)
Physical form completed and faxed back to [facility] at [phone]. Form sent for scanning.  

## 2023-01-02 ENCOUNTER — Encounter: Payer: Managed Care, Other (non HMO) | Admitting: Obstetrics and Gynecology

## 2023-01-17 ENCOUNTER — Encounter: Payer: Self-pay | Admitting: *Deleted

## 2023-01-18 ENCOUNTER — Encounter: Payer: Self-pay | Admitting: *Deleted

## 2023-05-03 ENCOUNTER — Ambulatory Visit (INDEPENDENT_AMBULATORY_CARE_PROVIDER_SITE_OTHER): Payer: Managed Care, Other (non HMO) | Admitting: Internal Medicine

## 2023-05-03 ENCOUNTER — Encounter: Payer: Self-pay | Admitting: Internal Medicine

## 2023-05-03 VITALS — BP 134/80 | HR 94 | Temp 98.5°F | Resp 16 | Ht 66.0 in | Wt 193.2 lb

## 2023-05-03 DIAGNOSIS — J4541 Moderate persistent asthma with (acute) exacerbation: Secondary | ICD-10-CM

## 2023-05-03 MED ORDER — PREDNISONE 10 MG PO TABS
ORAL_TABLET | ORAL | 0 refills | Status: DC
Start: 2023-05-03 — End: 2023-07-19

## 2023-05-03 MED ORDER — AZITHROMYCIN 250 MG PO TABS: ORAL_TABLET | ORAL | 0 refills | Status: DC

## 2023-05-03 NOTE — Patient Instructions (Signed)
Take prednisone for several days  Continue albuterol as needed for cough, chest congestion.  Robitussin DM or Mucinex DM for cough.  Get Astepro, and nasal spray OTC: 2 sprays on each side of the nose twice daily  Okay to get oral decongestant when needed.  If you are not gradually better in the next few days, start antibiotic called Zithromax.  If you are not much improved in the next week let me know.  Call anytime if severe symptoms

## 2023-05-03 NOTE — Progress Notes (Signed)
   Subjective:    Patient ID: Maureen Spencer, female    DOB: 1970-08-14, 52 y.o.   MRN: [id number]  DOS:  05/03/2023 Type of visit - description: Acute  Symptoms started 2 weeks ago: Increased cough, along with chest congestion, sinus congestion, abundant postnasal dripping. She takes albuterol and most symptoms decrease. She also has been taking OTC oral decongestants, they help temporarily the sinus pressure and congestion.  No fever or chills. Has sore throat "from coughing". Also some chest pain from cough.  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   meloxicam (MOBIC) 15 mg, Oral, Daily PRN   Multiple Vitamins-Iron (ONE DAILY/IRON PO) iron  DAILY   norgestimate-ethinyl estradiol (ORTHO-CYCLEN) 0.25-35 MG-MCG tablet 1 tablet, Oral, Daily   predniSONE (DELTASONE) 10 MG tablet 4 tablets x 2 days, 3 tabs x 2 days, 2 tabs x 2 days, 1 tab x 2 days       Objective:   Physical Exam BP 134/80   Pulse 94   Temp 98.5 F (36.9 C) (Oral)   Resp 16   Ht 5\' 6"  (1.676 m)   Wt 193 lb 4 oz (87.7 kg)   SpO2 96%   BMI 31.19 kg/m  General:   Well developed, NAD, BMI noted. HEENT:  Normocephalic . Face symmetric, atraumatic TMs slightly bulging but not red.  Sinuses non-TTP.  Nose congested.  Throat symmetric, not red Lungs:  CTA B Normal respiratory effort, no intercostal  retractions, no accessory muscle use. Heart: RRR,  no murmur.  Lower extremities: no pretibial edema bilaterally  Skin: Not pale. Not jaundice Neurologic:  alert & oriented X3.  Speech normal, gait appropriate for age and unassisted Psych--  Cognition and judgment appear intact.  Cooperative with normal attention span and concentration.  Behavior appropriate. No anxious or depressed appearing.      Assessment     Assessment Asthma Eczema (hands) H/o HPV, high risk, abnormal Pap Uterine leiomyoma Not on BCP , perimenopausal as off 05-2021    PLAN Asthma exacerbation: Although exam today is benign, by history she most likely has an asthma exacerbation episode Plan: Round of prednisone, Mucinex DM, Astepro, oral decongestants if needed, albuterol as needed. If not better in few days, start Zithromax. Call if no much better in the next 2 weeks.

## 2023-05-03 NOTE — Assessment & Plan Note (Signed)
Asthma exacerbation: Although exam today is benign, by history she most likely has an asthma exacerbation episode Plan: Round of prednisone, Mucinex DM, Astepro, oral decongestants if needed, albuterol as needed. If not better in few days, start Zithromax. Call if no much better in the next 2 weeks.

## 2023-05-08 ENCOUNTER — Encounter: Payer: Self-pay | Admitting: Internal Medicine

## 2023-06-15 ENCOUNTER — Ambulatory Visit
Admission: EM | Admit: 2023-06-15 | Discharge: 2023-06-15 | Disposition: A | Discharge status: Home / Self Care | Payer: Managed Care, Other (non HMO) | Attending: Family Medicine | Admitting: Family Medicine

## 2023-06-15 DIAGNOSIS — R030 Elevated blood-pressure reading, without diagnosis of hypertension: Secondary | ICD-10-CM | POA: Diagnosis not present

## 2023-06-15 MED ORDER — AMLODIPINE BESYLATE 5 MG PO TABS: 5.0000 mg | ORAL_TABLET | Freq: Every day | ORAL | 0 refills | Status: DC

## 2023-06-15 NOTE — ED Provider Notes (Signed)
 [facility]    CSN: [id number] Arrival date & time: 06/15/23  1750      History   Chief Complaint Chief Complaint  Patient presents with   Hypertension    HPI Maureen Spencer is a 53 y.o. female presents for elevated blood pressure.  Patient reports 2 days of a consistently elevated blood pressure in the 160s to 180s over 100.  Denies any previous history of hypertension but does report a family history of such.  She reports a mild headache but denies chest pain, shortness of breath, visual changes, dizziness.  No change in diet.  She did start oral minoxidil and a topical agent for hair loss prior to onset.  Denies increase in salt intake.  No OTC cold medications.  She is going to contact her PCP tomorrow to make appointment for follow-up.  No other concerns at this time.   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      Home Medications    Prior to Admission medications   Medication Sig Start Date End Date Taking? Authorizing Provider  albuterol  (PROAIR  HFA) 108 (90 Base) MCG/ACT inhaler Inhale 2 puffs into the lungs every 4 (four) hours as needed for wheezing or shortness of breath. 10/10/22  Yes Paz, Aloysius BRAVO, MD  amLODipine  (NORVASC ) 5 MG tablet Take 1 tablet (5 mg total) by mouth daily. 06/15/23  Yes Dabid [place], Jodi R, NP   norgestimate-ethinyl estradiol (ORTHO-CYCLEN) 0.25-35 MG-MCG tablet Take 1 tablet by mouth daily. 02/14/23  Yes [provider]  azithromycin  (ZITHROMAX  Z-PAK) 250 MG tablet 2 tabs a day the first day, then 1 tab a day x 4 days 05/03/23   Paz, Jose E, MD  BIOTIN PO Take by mouth.    [provider]  hydrocortisone  2.5 % cream APPLY TOPICALLY 2 TIMES DAILY AS NEEDED. 10/10/22   Paz, Jose E, MD  meloxicam (MOBIC) 15 MG tablet Take 15 mg by mouth daily as needed. 03/15/23   [provider]  Multiple Vitamins-Iron (ONE DAILY/IRON PO) iron  DAILY    [provider]  predniSONE  (DELTASONE ) 10 MG tablet 4 tablets x 2 days, 3 tabs x 2 days, 2 tabs x 2 days, 1 tab x 2 days 05/03/23   Amon Aloysius BRAVO, MD    Family History Family History  Problem Relation Age of Onset   Hypertension Mother    Stomach cancer Mother 70   Breast cancer Mother 96   Colon cancer Father        dx age 40, early dx   Hypertension Father    Diabetes Other        Grandmonther-not specific   Coronary artery disease Neg Hx    Kidney  disease Neg Hx    Esophageal cancer Neg Hx    Rectal cancer Neg Hx     Social History Social History   Tobacco Use   Smoking status: Never   Smokeless tobacco: Never  Substance Use Topics   Alcohol use: Not Currently    Comment: Socially   Drug use: No     Allergies   Penicillins   Review of Systems Review of Systems  Cardiovascular:        Elevated blood pressure without history of hypertension     Physical Exam Triage Vital Signs ED Triage Vitals  Encounter Vitals Group     BP 06/15/23 1847 (!) 180/124     Systolic BP Percentile --      Diastolic BP Percentile --      Pulse Rate 06/15/23 1847 (!) 102     Resp 06/15/23 1847 16     Temp 06/15/23 1847 98.5 F (36.9 C)     Temp Source 06/15/23 1847 Oral     SpO2 06/15/23 1847 97 %     Weight --      Height --      Head Circumference --      Peak Flow --      Pain Score 06/15/23 1842  0     Pain Loc --      Pain Education --      Exclude from Growth Chart --    No data found.  Updated Vital Signs BP (!) 166/108 (BP Location: Right Arm)   Pulse (!) 102   Temp 98.5 F (36.9 C) (Oral)   Resp 16   SpO2 97%   Visual Acuity Right Eye Distance:   Left Eye Distance:   Bilateral Distance:    Right Eye Near:   Left Eye Near:    Bilateral Near:     Physical Exam Vitals and nursing note reviewed.  Constitutional:      General: She is not in acute distress.    Appearance: Normal appearance. She is not ill-appearing.  HENT:     Head: Normocephalic and atraumatic.  Eyes:     Pupils: Pupils are equal, round, and reactive to light.  Cardiovascular:     Rate and Rhythm: Normal rate and regular rhythm.     Heart sounds: Normal heart sounds.  Pulmonary:     Effort: Pulmonary effort is normal.     Breath sounds: Normal breath sounds.  Skin:    General: Skin is warm and dry.  Neurological:     General: No focal deficit present.     Mental Status: She is alert and oriented to person, place, and time.  Psychiatric:        Mood and Affect: Mood normal.        Behavior: Behavior normal.      UC Treatments / Results  Labs (all labs ordered are listed, but only abnormal results are displayed) Labs Reviewed - No data to display  EKG   Radiology No results found.  Procedures Procedures (including critical care time)  Medications Ordered in UC Medications - No data to display  Initial Impression / Assessment and Plan / UC Course  I have reviewed the triage vital signs and the nursing notes.  Pertinent labs & imaging results that were available during my care of the patient were reviewed by me and considered in my medical decision making (see chart for details).     Reviewed exam and symptoms with patient.  Patient  with 2 days of elevated BP without history.  BP on recheck did improve.  Currently with mild headache otherwise no symptoms.  Will do Norvasc   and have follow-up with PCP for further treatment and workup.  She was advised to keep a BP log and take to PCP for follow-up.  Discussed avoidance of salt and over-the-counter cold medications.  She was instructed to go to ER for any worsening symptoms, red flags were reviewed and she verbalized understanding. Final Clinical Impressions(s) / UC Diagnoses   Final diagnoses:  Elevated BP without diagnosis of hypertension     Discharge Instructions      Keep a blood pressure log and take this to your PCP for further evaluation of your blood pressure readings.  He may start Norvasc  once a day.  Avoid salt, caffeine, and over-the-counter cough medications.  Please follow-up with your PCP in 2 days for recheck.  Please go to the ER if you develop any worsening symptoms.  This includes but is not limited to severe headache, chest pain, shortness of breath, visual changes, dizziness, or any new concerns that arise.  I hope you feel better soon!     ED Prescriptions     Medication Sig Dispense Auth. Provider   amLODipine  (NORVASC ) 5 MG tablet Take 1 tablet (5 mg total) by mouth daily. 30 tablet Murtaza Shell, Jodi R, NP      PDMP not reviewed this encounter.   Loreda Myla SAUNDERS, NP 06/15/23 [phone]

## 2023-06-15 NOTE — ED Triage Notes (Signed)
 Pt presents to UC for c/o intermittent high blood pressure x2 months ago, with more frequent high blood pressure x5 days. Pt reports the home readings of 147/107 and 151/107 today. Family hx HTN. Pt is unsure if HTN is related to minoxidil she started taking 2 months ago.

## 2023-06-15 NOTE — Discharge Instructions (Addendum)
 Keep a blood pressure log and take this to your PCP for further evaluation of your blood pressure readings.  He may start Norvasc  once a day.  Avoid salt, caffeine, and over-the-counter cough medications.  Please follow-up with your PCP in 2 days for recheck.  Please go to the ER if you develop any worsening symptoms.  This includes but is not limited to severe headache, chest pain, shortness of breath, visual changes, dizziness, or any new concerns that arise.  I hope you feel better soon!

## 2023-06-19 ENCOUNTER — Ambulatory Visit: Payer: Managed Care, Other (non HMO) | Admitting: Internal Medicine

## 2023-07-05 ENCOUNTER — Ambulatory Visit: Payer: Managed Care, Other (non HMO) | Admitting: Internal Medicine

## 2023-07-19 ENCOUNTER — Encounter: Payer: Self-pay | Admitting: Internal Medicine

## 2023-07-19 ENCOUNTER — Ambulatory Visit: Payer: Managed Care, Other (non HMO) | Admitting: Internal Medicine

## 2023-07-19 VITALS — BP 138/88 | HR 108 | Temp 97.8°F | Resp 16 | Ht 66.0 in | Wt 188.5 lb

## 2023-07-19 DIAGNOSIS — I10 Essential (primary) hypertension: Secondary | ICD-10-CM | POA: Diagnosis not present

## 2023-07-19 MED ORDER — AMLODIPINE BESYLATE 10 MG PO TABS: 10.0000 mg | ORAL_TABLET | Freq: Every day | ORAL | 6 refills | Status: DC

## 2023-07-19 NOTE — Patient Instructions (Addendum)
 increase amlodipine  to 10 mg daily Stop minoxidil Continue low-salt diet and exercise regularly.  Check the  blood pressure regularly Blood pressure goal:  between 110/65 and  135/85. If it is consistently higher or lower, let me know     GO TO THE LAB : Get the blood work     Next visit with me April 2025, physical exam.  Please schedule it at the front desk

## 2023-07-19 NOTE — Progress Notes (Signed)
   Subjective:    Patient ID: Maureen Spencer, female    DOB: [birth year], 53 y.o.   MRN: [id number]  DOS:  07/19/2023 Type of visit - description: ED follow-up  Santina to the urgent care January 25. Chief complaint was elevated BP, in the 160, 180s, had mild headache.  No chest pain. At the ER, BP was 180/124 with a heart rate of 102 BP recheck 166/108. Was Rx amlodipine .  Taking amlodipine  5 mg since then, ambulatory BPs are not well-controlled. Interestingly, she was prescribed minoxidil 2.5 mg: Half tablet daily on June 02, 2023.  At this point headaches are really mild. No chest pain, no difficulty breathing.  No edema. Stress at baseline. Has changed her diet, + low-salt. Not taking decongestants.   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   Uterine leiomyoma     Past Surgical History:  Procedure Laterality Date   G 1 P 1     WISDOM TOOTH EXTRACTION      Current Outpatient Medications  Medication Instructions   albuterol  (PROAIR  HFA) 108 (90 Base) MCG/ACT inhaler 2 puffs, Inhalation, Every 4 hours PRN   amLODipine  (NORVASC ) 10 mg, Oral, Daily   BIOTIN PO Take by mouth.   hydrocortisone  2.5 % cream APPLY TOPICALLY 2 TIMES DAILY AS NEEDED.   meloxicam (MOBIC) 15 mg, Daily PRN   Multiple Vitamins-Iron (ONE DAILY/IRON PO) iron  DAILY   norgestimate-ethinyl estradiol (ORTHO-CYCLEN) 0.25-35 MG-MCG tablet 1 tablet, Daily       Objective:   Physical Exam BP 138/88   Pulse (!) 108   Temp 97.8 F (36.6 C) (Oral)   Resp 16   Ht 5' 6 (1.676 m)   Wt 188 lb 8 oz (85.5 kg)   SpO2 97%   BMI 30.42 kg/m  General:   Well developed, NAD, BMI noted.  HEENT:  Normocephalic . Face symmetric, atraumatic Neck: No thyromegaly Lungs:  CTA B Normal respiratory effort, no intercostal retractions, no accessory muscle  use. Heart: RRR,  no murmur.  Abdomen:  Not distended, soft, non-tender. No rebound or rigidity.  No bruit Skin: Not pale. Not jaundice Lower extremities: no pretibial edema bilaterally  Neurologic:  alert & oriented X3.  Speech normal, gait appropriate for age and unassisted Psych--  Cognition and judgment appear intact.  Cooperative with normal attention span and concentration.  Behavior appropriate. No anxious or depressed appearing.     Assessment     Assessment HTN dx 06/2023 Asthma Eczema (hands) H/o HPV, high risk, abnormal Pap Uterine leiomyoma Not on BCP , perimenopausal as off 05-2021    PLAN HTN: New onset of HTN, dx @ the UC 06/15/2023, BP was as high as the 180s, tachycardia noted. On June 02, 2023 started minoxidil for alopecia, that may account for the tachycardia. Started amlodipine , ambulatory BPs still 141/106, 140/110, 131/92. EKG today: Sinus, no LVH Plan: Stop minoxidil, increase amlodipine  to 10 mg daily but watch for low BPs, continue with a low-salt diet. BMP CBC TSH Monitor BPs at home Minimize NSAIDs RTC 2 months, CPX

## 2023-07-20 ENCOUNTER — Encounter: Payer: Self-pay | Admitting: Internal Medicine

## 2023-07-20 LAB — BASIC METABOLIC PANEL
BUN/Creatinine Ratio: 16 (ref 9–23)
BUN: 13 mg/dL (ref 6–24)
CO2: 24 mmol/L (ref 20–29)
Calcium: 9.6 mg/dL (ref 8.7–10.2)
Chloride: 105 mmol/L (ref 96–106)
Creatinine, Ser: 0.81 mg/dL (ref 0.57–1.00)
Glucose: 93 mg/dL (ref 70–99)
Potassium: 4.5 mmol/L (ref 3.5–5.2)
Sodium: 144 mmol/L (ref 134–144)
eGFR: 88 mL/min/{1.73_m2} (ref >59)

## 2023-07-20 LAB — CBC WITH DIFFERENTIAL/PLATELET
Basophils Absolute: 0.1 10*3/uL (ref 0.0–0.2)
Basos: 1 %
EOS (ABSOLUTE): 0.1 10*3/uL (ref 0.0–0.4)
Eos: 1 %
Hematocrit: 43 % (ref 34.0–46.6)
Hemoglobin: 14.2 g/dL (ref 11.1–15.9)
Immature Grans (Abs): 0 10*3/uL (ref 0.0–0.1)
Immature Granulocytes: 0 %
Lymphocytes Absolute: 1.9 10*3/uL (ref 0.7–3.1)
Lymphs: 43 %
MCH: 30.4 pg (ref 26.6–33.0)
MCHC: 33 g/dL (ref 31.5–35.7)
MCV: 92 fL (ref 79–97)
Monocytes Absolute: 0.3 10*3/uL (ref 0.1–0.9)
Monocytes: 7 %
Neutrophils Absolute: 2.1 10*3/uL (ref 1.4–7.0)
Neutrophils: 48 %
Platelets: 317 10*3/uL (ref 150–450)
RBC: 4.67 x10E6/uL (ref 3.77–5.28)
RDW: 13.7 % (ref 11.7–15.4)
WBC: 4.3 10*3/uL (ref 3.4–10.8)

## 2023-07-20 LAB — TSH: TSH: 1.69 u[IU]/mL (ref 0.450–4.500)

## 2023-07-20 NOTE — Assessment & Plan Note (Signed)
 HTN: New onset of HTN, dx @ the UC 06/15/2023, BP was as high as the 180s, tachycardia noted. On June 02, 2023 started minoxidil for alopecia, that may account for the tachycardia. Started amlodipine , ambulatory BPs still 141/106, 140/110, 131/92. EKG today: Sinus, no LVH Plan: Stop minoxidil, increase amlodipine  to 10 mg daily but watch for low BPs, continue with a low-salt diet. BMP CBC TSH Monitor BPs at home Minimize NSAIDs RTC 2 months, CPX

## 2023-07-26 ENCOUNTER — Encounter: Payer: Self-pay | Admitting: Internal Medicine

## 2023-07-26 ENCOUNTER — Ambulatory Visit: Payer: Managed Care, Other (non HMO) | Attending: Internal Medicine

## 2023-07-26 DIAGNOSIS — R000 Tachycardia, unspecified: Secondary | ICD-10-CM

## 2023-07-26 NOTE — Telephone Encounter (Signed)
Arrange a Holter monitor, Dx tachycardia.  Consider further evaluation based on results.  Data from patient: Ambulatory BPs: 135/102, 126/91, 127/97. Heart rate ranges from 113-129.

## 2023-07-26 NOTE — Addendum Note (Signed)
Addended byConrad Ferrum D on: 07/26/2023 11:50 AM   Modules accepted: Orders

## 2023-07-26 NOTE — Progress Notes (Unsigned)
EP to read.

## 2023-08-16 IMAGING — MG MM DIGITAL SCREENING BILAT W/ TOMO AND CAD
8 series · 8 of 24 positions shown · non-contrast
Comparison: Previous exam(s).

CLINICAL DATA: Screening.

EXAM:
DIGITAL SCREENING BILATERAL MAMMOGRAM WITH TOMOSYNTHESIS AND CAD
TECHNIQUE: Bilateral screening digital craniocaudal and mediolateral oblique
mammograms were obtained. Bilateral screening digital breast
tomosynthesis was performed. The images were evaluated with
computer-aided detection.

[L MLO synth-2D]
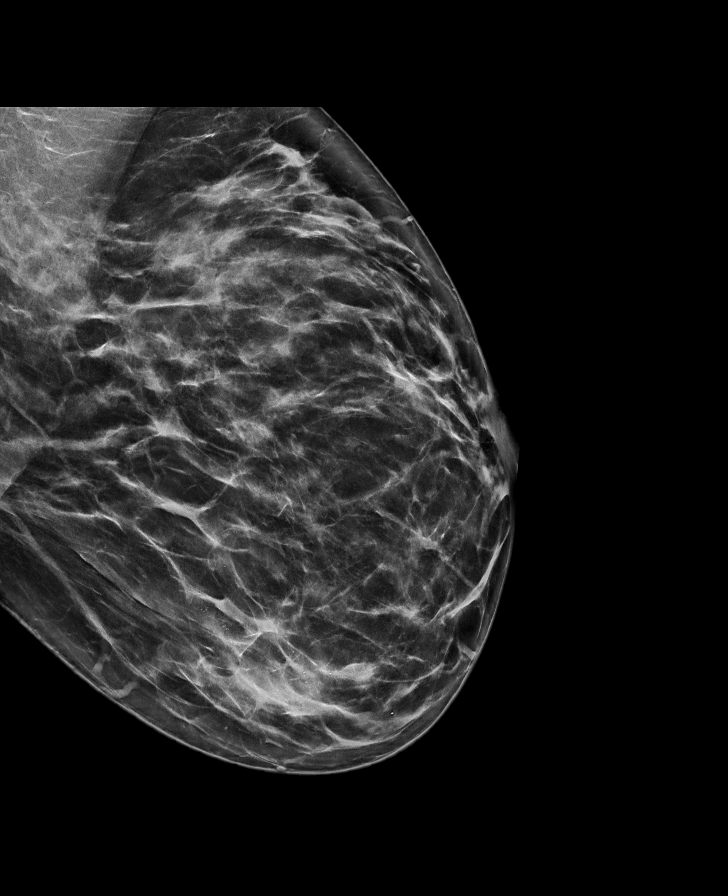

[L CC synth-2D]
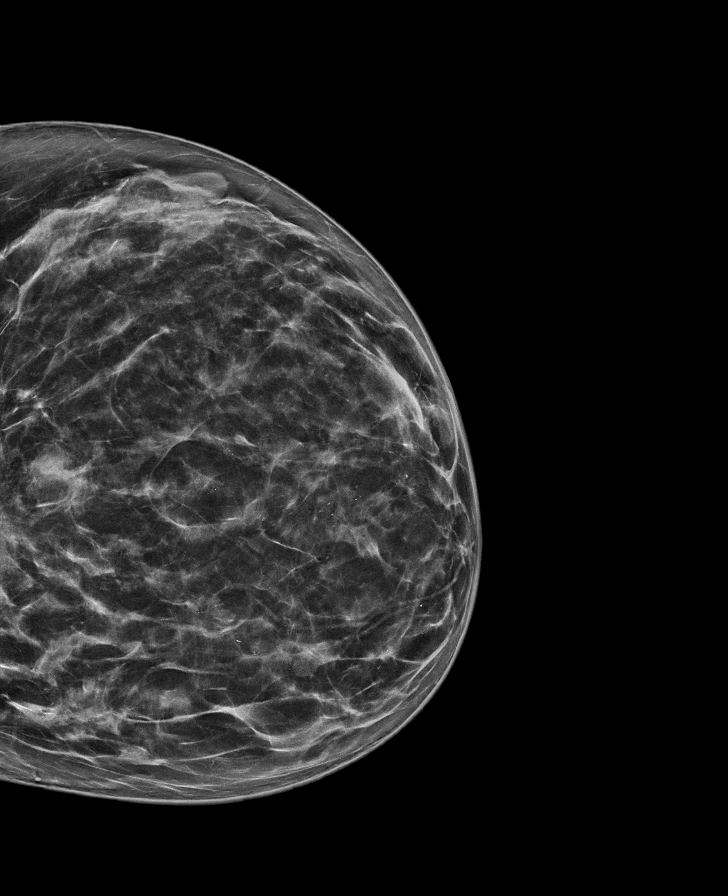

[R MLO synth-2D]
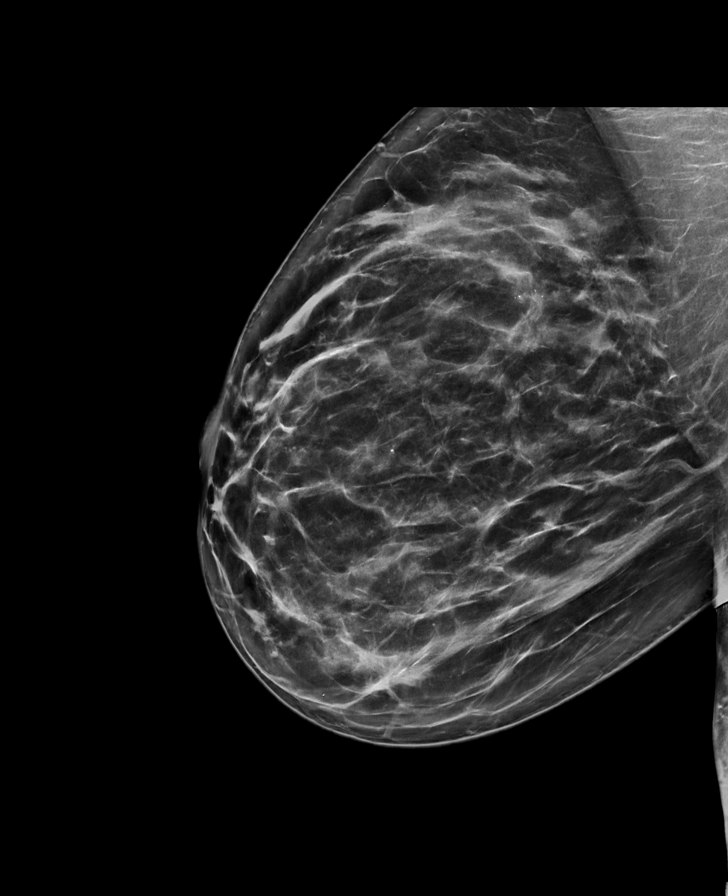

[R CC synth-2D]
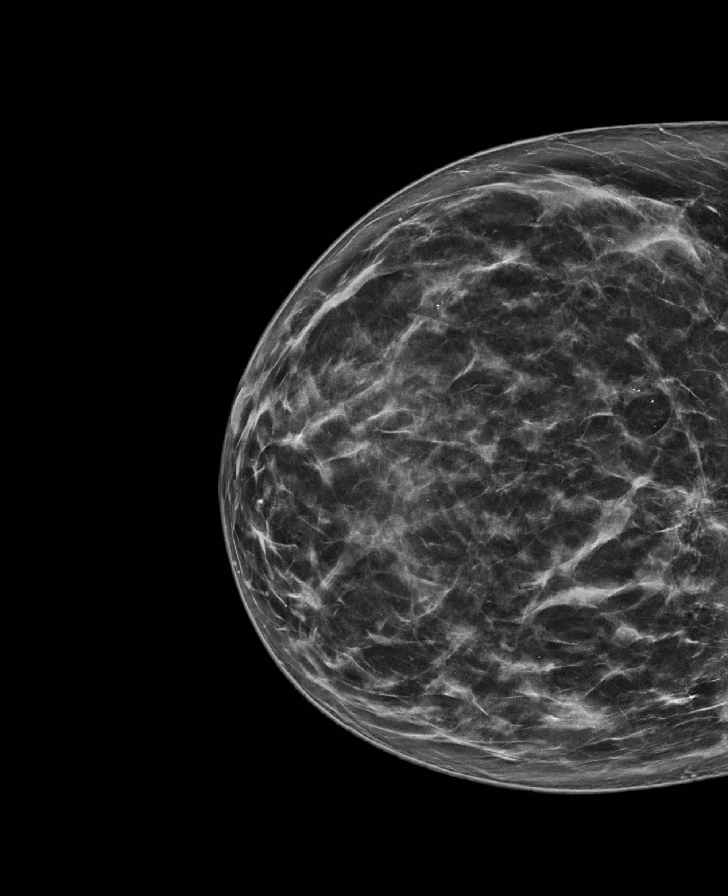

[L MLO tomo · tomo slice 43/86.0]
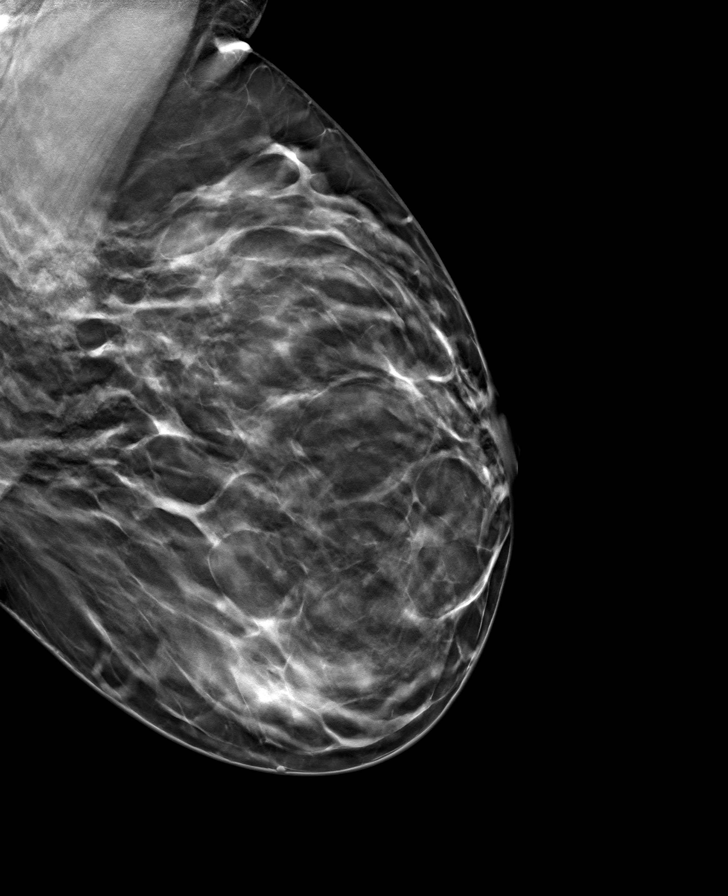

[R MLO tomo · tomo slice 46/91.0]
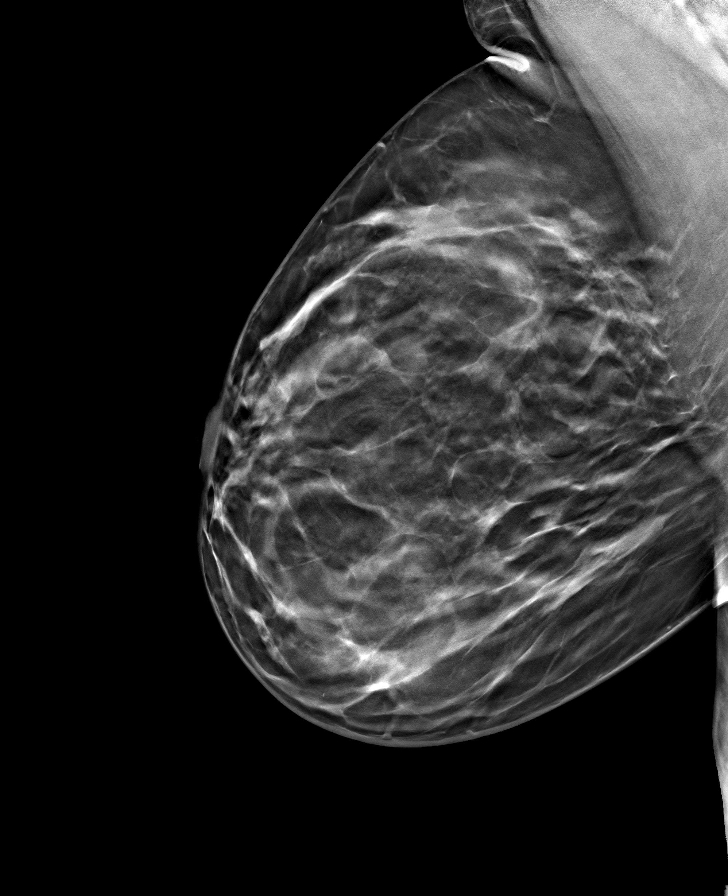

[L CC tomo · tomo slice 38/75.0]
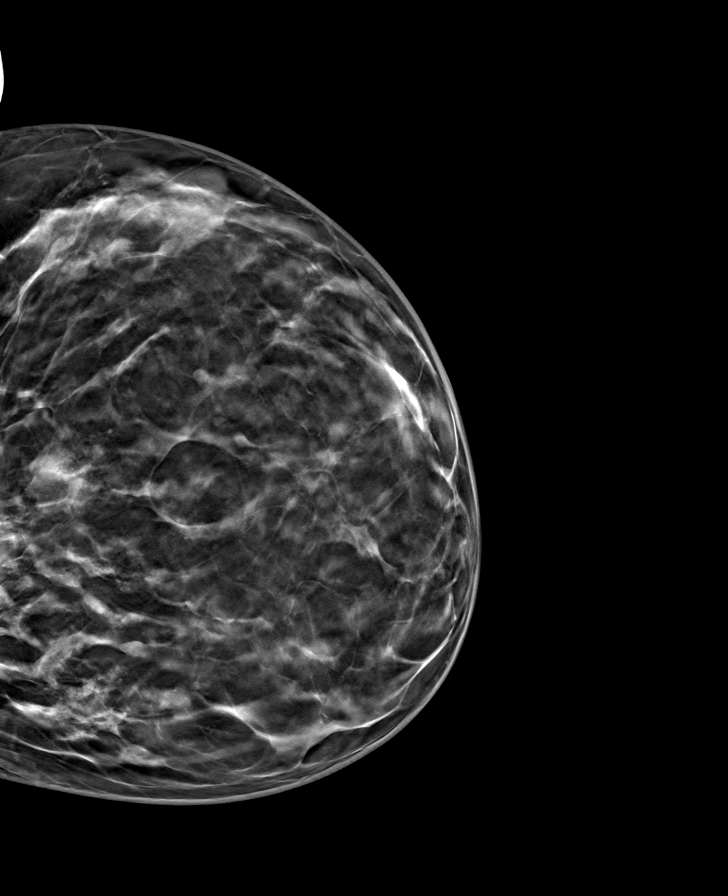

[R CC tomo · tomo slice 39/78.0]
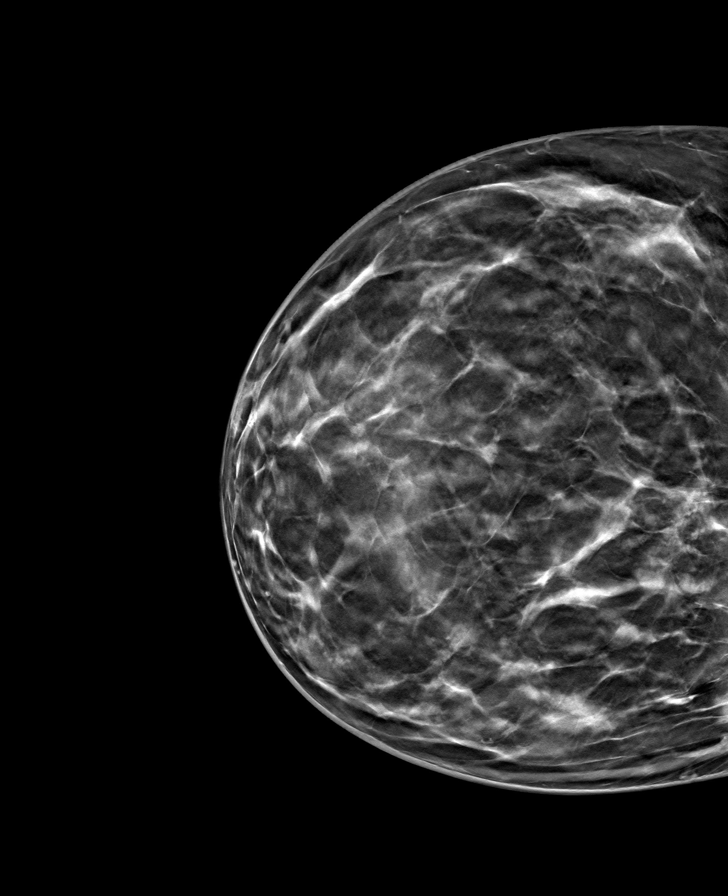

[8 of 24 positions shown; findings below may reference images not displayed]

ACR Breast Density Category c: The breast tissue is heterogeneously
dense, which may obscure small masses.
FINDINGS: There are no findings suspicious for malignancy.
IMPRESSION: No mammographic evidence of malignancy. A result letter of this
screening mammogram will be mailed directly to the patient.

RECOMMENDATION:
Screening mammogram in one year. (Code:Q3-W-BC3)

BI-RADS CATEGORY  1: Negative.

## 2023-09-20 ENCOUNTER — Encounter: Payer: Self-pay | Admitting: Internal Medicine

## 2023-10-25 ENCOUNTER — Telehealth: Payer: Self-pay

## 2023-10-25 MED ORDER — ALBUTEROL SULFATE HFA 108 (90 BASE) MCG/ACT IN AERS: 2.0000 | INHALATION_SPRAY | RESPIRATORY_TRACT | 5 refills | Status: AC | PRN

## 2023-10-25 NOTE — Telephone Encounter (Signed)
 Copied from CRM [id number]. Topic: Clinical - Medication Question >> Oct 25, 2023 10:46 AM Maureen Spencer D wrote: Reason for CRM: Pt stated that she needs her medication refilled for the albuterol  (PROAIR  HFA) 108 (90 Base) MCG/ACT inhaler. Pt stated that she is currently out of medication and will be going out of town soon. Pt stated that she requested a refill through the pharmacy and the pharmacy is waiting on a response from the provider.

## 2023-11-29 ENCOUNTER — Other Ambulatory Visit: Payer: Self-pay | Admitting: Obstetrics & Gynecology

## 2023-11-29 DIAGNOSIS — Z1231 Encounter for screening mammogram for malignant neoplasm of breast: Secondary | ICD-10-CM

## 2023-12-20 ENCOUNTER — Ambulatory Visit
Admission: RE | Admit: 2023-12-20 | Discharge: 2023-12-20 | Disposition: A | Discharge status: Home / Self Care | Source: Ambulatory Visit | Attending: Obstetrics & Gynecology | Admitting: Obstetrics & Gynecology

## 2023-12-20 DIAGNOSIS — Z1231 Encounter for screening mammogram for malignant neoplasm of breast: Secondary | ICD-10-CM

## 2024-01-05 ENCOUNTER — Other Ambulatory Visit: Payer: Self-pay | Admitting: Internal Medicine

## 2024-05-21 ENCOUNTER — Encounter: Payer: Self-pay | Admitting: Internal Medicine

## 2024-05-21 ENCOUNTER — Telehealth: Payer: Self-pay

## 2024-05-21 NOTE — Telephone Encounter (Signed)
 Attempted to reach patient concerning colonoscopy recall; unable to speak with patient;  left message and number to the office for patient to call back and schedule appts;

## 2024-05-22 NOTE — Telephone Encounter (Signed)
 Patient returned call to the office and was scheduled for a PV as well as a colonoscopy;

## 2024-07-04 ENCOUNTER — Ambulatory Visit

## 2024-07-04 VITALS — Ht 66.0 in | Wt 181.0 lb

## 2024-07-04 DIAGNOSIS — Z860100 Personal history of colon polyps, unspecified: Secondary | ICD-10-CM

## 2024-07-04 MED ORDER — NA SULFATE-K SULFATE-MG SULF 17.5-3.13-1.6 GM/177ML PO SOLN: 1.0000 | Freq: Once | ORAL | 0 refills | Status: AC

## 2024-07-04 NOTE — Progress Notes (Signed)

## 2024-07-08 ENCOUNTER — Encounter: Payer: Self-pay | Admitting: Internal Medicine

## 2024-07-16 ENCOUNTER — Encounter: Payer: Self-pay | Admitting: Internal Medicine

## 2024-07-17 NOTE — Progress Notes (Unsigned)
 [facility] History and Physical   Primary Care Physician:  Amon Aloysius BRAVO, MD   Reason for Procedure:    Encounter Diagnosis  Name Primary?   Hx of colonic polyps Yes     Plan:    colonoscopy   The patient was provided an opportunity to ask questions and all were answered. The patient agreed with the plan.   HPI: Maureen Spencer is a 54 y.o. female here for surveillance exam - she had 2 diminutive adenomas removed 2020 by Dr. Eda.   Past Medical History:  Diagnosis Date   Asthma    ELEVATED BLOOD PRESSURE 05/20/2009   History of abnormal cervical Pap smear    HPV in female 2015   High risk   Uterine leiomyoma     Past Surgical History:  Procedure Laterality Date   G 1 P 1     WISDOM TOOTH EXTRACTION       Current Outpatient Medications  Medication Sig Dispense Refill   albuterol  (PROAIR  HFA) 108 (90 Base) MCG/ACT inhaler Inhale 2 puffs into the lungs every 4 (four) hours as needed for wheezing or shortness of breath. 18 g 5   amLODipine  (NORVASC ) 10 MG tablet Take 1 tablet (10 mg total) by mouth daily. 30 tablet 6   ASHWAGANDHA PO Take by mouth.     Bacillus Coagulans-Inulin (ALIGN PREBIOTIC-PROBIOTIC PO) Take by mouth.     BIOTIN PO Take by mouth.     clobetasol ointment (TEMOVATE) 0.05 % Apply 1 Application topically 2 (two) times daily.     finasteride (PROSCAR) 5 MG tablet Take 5 mg by mouth daily.     hydrocortisone  2.5 % cream APPLY TOPICALLY 2 TIMES DAILY AS NEEDED. 28 g 3   magnesium  30 MG tablet Take 30 mg by mouth 2 (two) times daily.     meloxicam (MOBIC) 15 MG tablet Take 15 mg by mouth daily as needed.     Multiple Vitamins-Iron (ONE DAILY/IRON PO) iron  DAILY     norgestimate-ethinyl estradiol (ORTHO-CYCLEN) 0.25-35 MG-MCG tablet Take 1 tablet by mouth daily.     Vitamin D, Ergocalciferol, (DRISDOL) 1.25 MG (50000 UNIT) CAPS capsule Take 50,000 Units by mouth every 7 (seven) days.     No current facility-administered medications for  this visit.    Allergies as of 07/18/2024 - Review Complete 07/04/2024  Allergen Reaction Noted   Penicillins  01/05/2011    Family History  Problem Relation Age of Onset   Hypertension Mother    Stomach cancer Mother 47   Breast cancer Mother 75   Prostate cancer Father    Hypertension Father    Diabetes Other        Grandmonther-not specific   Coronary artery disease Neg Hx    Kidney disease Neg Hx    Esophageal cancer Neg Hx    Rectal cancer Neg Hx    Colon cancer Neg Hx     Social History   Socioeconomic History   Marital status: Single    Spouse name: Not on file   Number of children: 1   Years of education: Not on file   Highest education level: Bachelor's degree (e.g., BA, AB, BS)  Occupational History   Occupation: Environmental Education Officer  Tobacco Use   Smoking status: Never   Smokeless tobacco: Never  Vaping Use   Vaping status: Never Used  Substance and Sexual Activity   Alcohol use: Not Currently    Comment: Socially   Drug use: No  Sexual activity: Never    Birth control/protection: Abstinence  Other Topics Concern   Not on file  Social History Narrative   Divorced   son 18   Parents in [place]       Social Drivers of Health   Tobacco Use: Low Risk (07/04/2024)   Patient History    Smoking Tobacco Use: Never    Smokeless Tobacco Use: Never    Passive Exposure: Not on file  Financial Resource Strain: Low Risk (06/18/2023)   Overall Financial Resource Strain (CARDIA)    Difficulty of Paying Living Expenses: Not hard at all  Food Insecurity: No Food Insecurity (06/18/2023)   Hunger Vital Sign    Worried About Running Out of Food in the Last Year: Never true    Ran Out of Food in the Last Year: Never true  Transportation Needs: No Transportation Needs (06/18/2023)   PRAPARE - Administrator, Civil Service (Medical): No    Lack of Transportation (Non-Medical): No  Physical Activity: Insufficiently Active (06/18/2023)   Exercise Vital Sign     Days of Exercise per Week: 3 days    Minutes of Exercise per Session: 40 min  Stress: No Stress Concern Present (06/18/2023)   [employer] of Occupational Health - Occupational Stress Questionnaire    Feeling of Stress : Not at all  Social Connections: Socially Isolated (06/18/2023)   Social Connection and Isolation Panel    Frequency of Communication with Friends and Family: More than three times a week    Frequency of Social Gatherings with Friends and Family: Twice a week    Attends Religious Services: Never    Database Administrator or Organizations: No    Attends Engineer, Structural: Not on file    Marital Status: Divorced  Intimate Partner Violence: Not on file  Depression (PHQ2-9): Low Risk (07/19/2023)   Depression (PHQ2-9)    PHQ-2 Score: 0  Alcohol Screen: Not on file  Housing: Low Risk (06/18/2023)   Housing Stability Vital Sign    Unable to Pay for Housing in the Last Year: No    Number of Times Moved in the Last Year: 0    Homeless in the Last Year: No  Utilities: Not on file  Health Literacy: Not on file    Review of Systems: Positive for *** All other review of systems negative except as mentioned in the HPI.  Physical Exam: Vital signs There were no vitals taken for this visit.  General:   Alert,  Well-developed, well-nourished, pleasant and cooperative in NAD Lungs:  Clear throughout to auscultation.   Heart:  Regular rate and rhythm; no murmurs, clicks, rubs,  or gallops. Abdomen:  Soft, nontender and nondistended. Normal bowel sounds.   Neuro/Psych:  Alert and cooperative. Normal mood and affect. A and O x 3   @Jaheem Hedgepath  CHARLENA Commander, MD, [facility] [phone] (pager) 07/17/2024 9:47 PM@

## 2024-07-18 ENCOUNTER — Encounter: Payer: Self-pay | Admitting: Internal Medicine

## 2024-07-18 ENCOUNTER — Ambulatory Visit: Admitting: Internal Medicine

## 2024-07-18 VITALS — BP 127/86 | HR 61 | Temp 97.4°F | Resp 11 | Ht 66.0 in | Wt 181.0 lb

## 2024-07-18 DIAGNOSIS — D123 Benign neoplasm of transverse colon: Secondary | ICD-10-CM

## 2024-07-18 DIAGNOSIS — Z860100 Personal history of colon polyps, unspecified: Secondary | ICD-10-CM

## 2024-07-18 MED ORDER — SODIUM CHLORIDE 0.9 % IV SOLN: 500.0000 mL | Freq: Once | INTRAVENOUS | Status: DC

## 2024-07-18 NOTE — Progress Notes (Signed)
 Report given to PACU, vss

## 2024-07-18 NOTE — Patient Instructions (Addendum)
 I found and removed 1 tiny polyp that looks benign.  I will let you know pathology results and when to have another routine colonoscopy by mail and/or My Chart.  I appreciate the opportunity to care for you. Lupita CHARLENA Commander, MD, [facility]   Resume previous diet. Continue present medications. Repeat colonoscopy is recommended for surveillance. The colonoscopy date will be determined after pathology results from today's exam become available for review.  Handout on polyps.    YOU HAD AN ENDOSCOPIC PROCEDURE TODAY AT THE [facility]:   Refer to the procedure report that was given to you for any specific questions about what was found during the examination.  If the procedure report does not answer your questions, please call your gastroenterologist to clarify.  If you requested that your care partner not be given the details of your procedure findings, then the procedure report has been included in a sealed envelope for you to review at your convenience later.  YOU SHOULD EXPECT: Some feelings of bloating in the abdomen. Passage of more gas than usual.  Walking can help get rid of the air that was put into your GI tract during the procedure and reduce the bloating. If you had a lower endoscopy (such as a colonoscopy or flexible sigmoidoscopy) you may notice spotting of blood in your stool or on the toilet paper. If you underwent a bowel prep for your procedure, you may not have a normal bowel movement for a few days.  Please Note:  You might notice some irritation and congestion in your nose or some drainage.  This is from the oxygen used during your procedure.  There is no need for concern and it should clear up in a day or so.  SYMPTOMS TO REPORT IMMEDIATELY:  Following lower endoscopy (colonoscopy or flexible sigmoidoscopy):  Excessive amounts of blood in the stool  Significant tenderness or worsening of abdominal pains  Swelling of the abdomen that is new, acute  Fever of 100F or  higher  For urgent or emergent issues, a gastroenterologist can be reached at any hour by calling [phone]. Do not use MyChart messaging for urgent concerns.    DIET:  We do recommend a small meal at first, but then you may proceed to your regular diet.  Drink plenty of fluids but you should avoid alcoholic beverages for 24 hours.  ACTIVITY:  You should plan to take it easy for the rest of today and you should NOT DRIVE or use heavy machinery until tomorrow (because of the sedation medicines used during the test).    FOLLOW UP: Our staff will call the number listed on your records the next business day following your procedure.  We will call around 7:15- 8:00 am to check on you and address any questions or concerns that you may have regarding the information given to you following your procedure. If we do not reach you, we will leave a message.     If any biopsies were taken you will be contacted by phone or by letter within the next 1-3 weeks.  Please call us  at [phone] if you have not heard about the biopsies in 3 weeks.    SIGNATURES/CONFIDENTIALITY: You and/or your care partner have signed paperwork which will be entered into your electronic medical record.  These signatures attest to the fact that that the information above on your After Visit Summary has been reviewed and is understood.  Full responsibility of the confidentiality of this discharge information  lies with you and/or your care-partner.

## 2024-07-18 NOTE — Progress Notes (Signed)
 Called to room to assist during endoscopic procedure.  Patient ID and intended procedure confirmed with present staff. Received instructions for my participation in the procedure from the performing physician.

## 2024-07-18 NOTE — Progress Notes (Signed)
 VS by KP  Pt's states no medical or surgical changes since previsit or office visit.

## 2024-07-18 NOTE — Op Note (Signed)
 [facility] Patient Name: Maureen Spencer Procedure Date: 07/18/2024 10:59 AM MRN: [id number] Endoscopist: Lupita FORBES Commander , MD, [id number] Age: 54 Referring MD:  Date of Birth: May 04, 1971 Gender: Female Account #: [id number] Procedure:                Colonoscopy Indications:              Surveillance: Personal history of adenomatous                            polyps on last colonoscopy > 5 years ago, Last                            colonoscopy: January 2020 Medicines:                Monitored Anesthesia Care Procedure:                Pre-Anesthesia Assessment:                           - Prior to the procedure, a History and Physical                            was performed, and patient medications and                            allergies were reviewed. The patient's tolerance of                            previous anesthesia was also reviewed. The risks                            and benefits of the procedure and the sedation                            options and risks were discussed with the patient.                            All questions were answered, and informed consent                            was obtained. Prior Anticoagulants: The patient has                            taken no anticoagulant or antiplatelet agents. ASA                            Grade Assessment: II - A patient with mild systemic                            disease. After reviewing the risks and benefits,                            the patient was deemed in satisfactory condition to  undergo the procedure.                           After obtaining informed consent, the colonoscope                            was passed under direct vision. Throughout the                            procedure, the patient's blood pressure, pulse, and                            oxygen saturations were monitored continuously. The                            CF [id number] #[id number] was introduced  through the anus                            and advanced to the the cecum, identified by                            appendiceal orifice and ileocecal valve. The                            colonoscopy was performed without difficulty. The                            patient tolerated the procedure well. The quality                            of the bowel preparation was excellent. The                            ileocecal valve, appendiceal orifice, and rectum                            were photographed. The bowel preparation used was                            SUPREP via split dose instruction. Scope In: 11:04:38 AM Scope Out: 11:15:25 AM Scope Withdrawal Time: 0 hours 8 minutes 15 seconds  Total Procedure Duration: 0 hours 10 minutes 47 seconds  Findings:                 The perianal and digital rectal examinations were                            normal.                           A 3 mm polyp was found in the distal transverse                            colon. The polyp was sessile. The polyp was removed  with a cold snare. Resection and retrieval were                            complete. Verification of patient identification                            for the specimen was done. Estimated blood loss was                            minimal.                           The exam was otherwise without abnormality on                            direct and retroflexion views. Complications:            No immediate complications. Estimated Blood Loss:     Estimated blood loss was minimal. Impression:               - One 3 mm polyp in the distal transverse colon,                            removed with a cold snare. Resected and retrieved.                           - The examination was otherwise normal on direct                            and retroflexion views.                           - Personal history of colonic polyps - 2 diminutive                             adenomas removed january 2020. Recommendation:           - Patient has a contact number available for                            emergencies. The signs and symptoms of potential                            delayed complications were discussed with the                            patient. Return to normal activities tomorrow.                            Written discharge instructions were provided to the                            patient.                           - Resume previous diet.                           -  Continue present medications.                           - Repeat colonoscopy is recommended for                            surveillance. The colonoscopy date will be                            determined after pathology results from today's                            exam become available for review. Lupita FORBES Commander, MD 07/18/2024 11:21:08 AM This report has been signed electronically.

## 2024-07-19 ENCOUNTER — Telehealth: Payer: Self-pay | Admitting: *Deleted

## 2024-07-19 NOTE — Telephone Encounter (Signed)
" °  Follow up Call-     07/18/2024   10:08 AM  Call back number  Post procedure Call Back phone  # [phone]  Permission to leave phone message Yes     Patient questions:  Do you have a fever, pain , or abdominal swelling? No. Pain Score  0 *  Have you tolerated food without any problems? Yes.    Have you been able to return to your normal activities? Yes.    Do you have any questions about your discharge instructions: Diet   No. Medications  No. Follow up visit  No.  Do you have questions or concerns about your Care? No.  Actions: * If pain score is 4 or above: No action needed, pain <4.   "

## 2024-10-04 ENCOUNTER — Encounter: Admitting: Internal Medicine
